# Patient Record
Sex: Male | Born: 1995 | Race: Black or African American | Hispanic: No | Marital: Single | Smoking: Never smoker
Health system: Southern US, Community
[De-identification: ages and names within clinical notes are randomized; demographics above are authoritative.]

---
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
## 2019-01-01 ENCOUNTER — Emergency Department
Admission: EM | Admit: 2019-01-01 | Discharge: 2019-01-01 | Disposition: A | Discharge status: Home / Self Care | Payer: Self-pay | Attending: Emergency Medicine | Admitting: Emergency Medicine

## 2019-01-01 ENCOUNTER — Other Ambulatory Visit: Payer: Self-pay

## 2019-01-01 ENCOUNTER — Encounter: Payer: Self-pay | Admitting: Emergency Medicine

## 2019-01-01 DIAGNOSIS — Y92310 Basketball court as the place of occurrence of the external cause: Secondary | ICD-10-CM | POA: Insufficient documentation

## 2019-01-01 DIAGNOSIS — F1210 Cannabis abuse, uncomplicated: Secondary | ICD-10-CM | POA: Insufficient documentation

## 2019-01-01 DIAGNOSIS — W500XXA Accidental hit or strike by another person, initial encounter: Secondary | ICD-10-CM | POA: Insufficient documentation

## 2019-01-01 DIAGNOSIS — Y998 Other external cause status: Secondary | ICD-10-CM | POA: Insufficient documentation

## 2019-01-01 DIAGNOSIS — S01511A Laceration without foreign body of lip, initial encounter: Secondary | ICD-10-CM | POA: Insufficient documentation

## 2019-01-01 DIAGNOSIS — Y9367 Activity, basketball: Secondary | ICD-10-CM | POA: Insufficient documentation

## 2019-01-01 MED ORDER — BACITRACIN-NEOMYCIN-POLYMYXIN 400-5-5000 EX OINT
TOPICAL_OINTMENT | Freq: Once | CUTANEOUS | Status: AC
  Administered 2019-01-01: 1 via TOPICAL
  Filled 2019-01-01: qty 1

## 2019-01-01 MED ORDER — LIDOCAINE HCL (PF) 1 % IJ SOLN
INTRAMUSCULAR | Status: AC
  Administered 2019-01-01: 10 mL
  Filled 2019-01-01: qty 10

## 2019-01-01 MED ORDER — LIDOCAINE HCL (PF) 1 % IJ SOLN
10.0000 mL | Freq: Once | INTRAMUSCULAR | Status: AC
  Administered 2019-01-01: 10 mL

## 2019-01-01 MED ORDER — AMOXICILLIN-POT CLAVULANATE 875-125 MG PO TABS: 1.0000 | ORAL_TABLET | Freq: Two times a day (BID) | ORAL | 0 refills | Status: DC

## 2019-01-01 MED ORDER — AMOXICILLIN-POT CLAVULANATE 875-125 MG PO TABS
1.0000 | ORAL_TABLET | Freq: Once | ORAL | Status: AC
  Administered 2019-01-01: 1 via ORAL
  Filled 2019-01-01 (×2): qty 1

## 2019-01-01 NOTE — ED Notes (Signed)
See triage note  Presents with injury to mouth  States he was elbowed to mouth  Laceration noted to upper lip near the [place] boarder   Tooth was stuck in lip  Was removed by provider on arrival

## 2019-01-01 NOTE — Op Note (Signed)
01/01/2019  6:23 PM    Jacky Kindle  [id number]   Pre-Op Dx: Laceration of the right upper lip involving the vermilion border all the way inside the lip through muscle and minor salivary glands.  Total length is 2.5 cm  Post-op Dx: Same  Proc: Complex repair of right upper lip laceration involving deep sutures and buried mucosal surface sutures and some Prolene sutures to the outside of the lip.  Surg:  Beverly Sessions Allyana Vogan  Anes:  GOT  EBL: Minimal  Comp: None  Findings: Jagged tear of the upper lip through his tooth.  This was from the vermilion border 1 cm above his oral commissure extending diagonally inside the mouth up to the right upper canine.  The tooth was buried in the lip when he first presented to the ER and then was removed.  This was through muscle and had minor salivary gland escaping through the opened lip wound.  He did not lacerate the labial artery.  Procedure: The wound was anesthetized with 1% lidocaine using approximately 4 mL.  After he was anesthetized he was washed with Betadine and water to clean the area and the wound.  The wound extended inside the mouth and unable to sterilize this area.  He was then draped in a sterile fashion.  Deep sutures were placed using 6-0 Vicryl to pull back together the muscle layers and to help buried the minor salivary glands under the mucosa.  The mucosa laceration was then sutured from the inside out using 5 oh chromics that had the knot buried.  Multiple interrupted sutures were placed for about 1-1/2 cm.  This left a little over a centimeter left of vermilion border that was visible from the outside.  6-0 Prolene was then used in running locking fashion for holding the vermilion border together properly.  Neosporin was then placed over the wound edge on the outside.  Dispo:   Patient is to be discharged home and rest at home.  Plan: Follow-up in the office in 4 days for suture removal of the Prolene.  He will use Vaseline on  his lip every day to protect it.  He will be given Augmentin for antibiotics to help prevent infection.  He will be given some pain medication from the ER physician to help control pain.  He can use some ice at home to help keep swelling to a minimum. Return sooner if problems.  Cammy Copa  01/01/2019 6:23 PM

## 2019-01-01 NOTE — Discharge Instructions (Addendum)
Keep the lip covered with a thin layer of petroleum jelly, throughout the day. Avoid licking or biting the sutures. Follow-up with Dr. Elenore RotaJuengel on Friday for suture removal. No sports until cleared by Dr. Elenore RotaJuengel.

## 2019-01-01 NOTE — ED Provider Notes (Signed)
[facility] Emergency Department Provider Note ____________________________________________  Time seen: upon arrival  I have reviewed the triage vital signs and the nursing notes.  HISTORY  Chief Complaint  Mouth Injury  HPI James Christensen is a 23 y.o. male resents himself to the ED from the gym, after he was hit in the upper lip by an elbow, while playing basketball.  Patient presents with a large laceration to the corner of the upper lip.  The injury caused his incisor tooth to impaled his upper lip on the buccal mucosa.  The deep laceration extends to the outer lip to the vermilion border.  He is unable to manipulate the upper lip at this time.  He denies any dental injury, loss of consciousness, nausea, vomiting and dizziness.  Patient denies any significant medical history and takes no daily medications.  History reviewed. No pertinent past medical history.  There are no active problems to display for this patient.  History reviewed. No pertinent surgical history.  Prior to Admission medications   Medication Sig Start Date End Date Taking? Authorizing Provider  amoxicillin-clavulanate (AUGMENTIN) 875-125 MG tablet Take 1 tablet by mouth 2 (two) times daily. 01/01/19   Loreli Debruler, Charlesetta Ivory, PA-C    Allergies Patient has no known allergies.  History reviewed. No pertinent family history.  Social History Social History   Tobacco Use  . Smoking status: Never Smoker  . Smokeless tobacco: Never Used  Substance Use Topics  . Alcohol use: Never    Frequency: Never  . Drug use: Yes    Types: Marijuana    Review of Systems  Constitutional: Negative for fever. Eyes: Negative for visual changes. ENT: Negative for sore throat. Upper lip laceration as above. Cardiovascular: Negative for chest pain. Respiratory: Negative for shortness of breath. Gastrointestinal: Negative for abdominal pain, vomiting and diarrhea. Genitourinary: Negative for  dysuria. Musculoskeletal: Negative for back pain. Skin: Negative for rash. Neurological: Negative for headaches, focal weakness or numbness. ____________________________________________  PHYSICAL EXAM:  VITAL SIGNS: ED Triage Vitals  Enc Vitals Group     BP 01/01/19 1552 119/68     Pulse Rate 01/01/19 1552 80     Resp 01/01/19 1552 18     Temp 01/01/19 1552 98.5 F (36.9 C)     Temp Source 01/01/19 1552 Oral     SpO2 01/01/19 1552 97 %     Weight 01/01/19 1549 135 lb (61.2 kg)     Height 01/01/19 1549 5\' 9"  (1.753 m)     Head Circumference --      Peak Flow --      Pain Score 01/01/19 1549 8     Pain Loc --      Pain Edu? --      Excl. in GC? --     Constitutional: Alert and oriented. Well appearing and in no distress. Head: Normocephalic and atraumatic. Eyes: Conjunctivae are normal. Normal extraocular movements Nose: No congestion/rhinorrhea/epistaxis. Mouth/Throat: Mucous membranes are moist. Upper lip with complex laceration exposing and extending through the orbicularis oris musculature. The dermal laceration extends to the vermilion border. No dental injury is appreciated. The primary incisor was initially embedded within the upper lip on the buccal side.  Cardiovascular: Normal rate, regular rhythm. Normal distal pulses. Respiratory: Normal respiratory effort. No wheezes/rales/rhonchi. Musculoskeletal: Nontender with normal range of motion in all extremities.  Neurologic:  Normal gait without ataxia. Normal speech and language. No gross focal neurologic deficits are appreciated. Skin:  Skin is warm, dry  and intact. No rash noted. ____________________________________________   MEDIA  See photos labeled "upper lip laceration" ____________________________________________  PROCEDURES  Procedures Augmentin 875 mg PO - dissolved in applesauce Neosporin ointment applied topically ____________________________________________  INITIAL IMPRESSION / ASSESSMENT AND PLAN  / ED COURSE  ----------------------------------------- 4:23 PM on 01/01/2019 ----------------------------------------- Page to P. Juengel (ENT) He will evaluate and treat the patient in the ED. See separate consult note for procedure note.  Patient with traumatic lip laceration, presents for treatment. The complex suture is repaired by the specialist, and initial antibiotic coverage is given with Augmentin. He will see Dr. Elenore RotaJuengel in the office on Friday for suture removal.  ____________________________________________  FINAL CLINICAL IMPRESSION(S) / ED DIAGNOSES  Final diagnoses:  Laceration of upper lip with complication, initial encounter      Lissa HoardMenshew, Kaleia Longhi V Bacon, PA-C 01/01/19 1837    Phineas SemenGoodman, Graydon, MD 01/01/19 [phone]

## 2019-01-01 NOTE — Consult Note (Signed)
James Christensen, James Christensen [id number] 1996-04-06 James Semen, MD  Reason for Consult: Acute laceration right upper lip  HPI: Patient is a 23 year old African-American male who is fairly healthy.  He was playing basketball, elbowed to his lip.  He thinks he was playing with his lip at the time of the elbow pushed his tooth right in the lip and cut his lip.  He is never had any stitches or lacerations to his lips before.  He did not have a lot of blood loss.  The laceration was noted in the ER to go from inside the lip all the way out to vermilion border.  This was through muscle and into some of the minor salivary glands.  Consultation was placed with ENT for repair of the lip laceration.  Allergies: No Known Allergies  ROS: Review of systems normal other than 12 systems except per HPI.  PMH: History reviewed. No pertinent past medical history.  FH: History reviewed. No pertinent family history.  SH:  Social History   Socioeconomic History  . Marital status: Single    Spouse name: Not on file  . Number of children: Not on file  . Years of education: Not on file  . Highest education level: Not on file  Occupational History  . Not on file  Social Needs  . Financial resource strain: Not on file  . Food insecurity:    Worry: Not on file    Inability: Not on file  . Transportation needs:    Medical: Not on file    Non-medical: Not on file  Tobacco Use  . Smoking status: Never Smoker  . Smokeless tobacco: Never Used  Substance and Sexual Activity  . Alcohol use: Never    Frequency: Never  . Drug use: Yes    Types: Marijuana  . Sexual activity: Not on file  Lifestyle  . Physical activity:    Days per week: Not on file    Minutes per session: Not on file  . Stress: Not on file  Relationships  . Social connections:    Talks on phone: Not on file    Gets together: Not on file    Attends religious service: Not on file    Active member of club or organization: Not on file   Attends meetings of clubs or organizations: Not on file    Relationship status: Not on file  . Intimate partner violence:    Fear of current or ex partner: Not on file    Emotionally abused: Not on file    Physically abused: Not on file    Forced sexual activity: Not on file  Other Topics Concern  . Not on file  Social History Narrative  . Not on file    PSH: History reviewed. No pertinent surgical history.  Physical  Exam: He is well-developed well-nourished no acute distress.  The laceration starts the right vermilion border about a centimeter above the right oral commissure.  Extends inward and a angled fashion up to the right upper canine tooth.  It is irregular laceration and tear that has angulation medially.  There is muscle showing and minor salivary glands that are extended out of the wound as it gapes open.  There is no significant bleeding.  His tooth is not loose and there is no intraoral lesions other than the laceration extends up into the mucosa inside the mouth.  No injury to his neck.  A complex repair of the laceration right upper lip was done in the  ER under local anesthesia.  This is dictated in detail elsewhere   A/P: Irregular laceration involving the full lip from the vermilion border extending inward into the mucosa up to the tooth.  Entire length of this was 2-1/2 cm.  It was closed in a complex repair.  He will cover it with Vaseline at home and will plan to come back to the office on Friday (4 days) for suture removal.  He has mostly buried sutures inside the lip but there are some 6-0 Prolene's on the outside lateral portion of the lip that is exposed to make sure that this heals appropriately.  He will be given antibiotics help prevent infection and will use pain medication as needed.   James Christensen 01/01/2019 6:17 PM

## 2019-01-01 NOTE — ED Triage Notes (Signed)
Pt got elbowed in upper lip. Through and through. Not into vermilion border.  Appears lip stuck on tooth still.

## 2019-01-05 ENCOUNTER — Ambulatory Visit
Admission: EM | Admit: 2019-01-05 | Discharge: 2019-01-05 | Discharge status: Against Medical Advice | Payer: Medicaid Other

## 2019-12-05 ENCOUNTER — Ambulatory Visit: Payer: Self-pay | Admitting: Family Medicine

## 2019-12-05 ENCOUNTER — Other Ambulatory Visit: Payer: Self-pay

## 2019-12-05 ENCOUNTER — Encounter: Payer: Self-pay | Admitting: Family Medicine

## 2019-12-05 DIAGNOSIS — Z113 Encounter for screening for infections with a predominantly sexual mode of transmission: Secondary | ICD-10-CM

## 2019-12-05 DIAGNOSIS — Z202 Contact with and (suspected) exposure to infections with a predominantly sexual mode of transmission: Secondary | ICD-10-CM

## 2019-12-05 MED ORDER — AZITHROMYCIN 500 MG PO TABS
1000.0000 mg | ORAL_TABLET | Freq: Once | ORAL | Status: AC
  Administered 2019-12-05: 10:00:00 1000 mg via ORAL

## 2019-12-05 NOTE — Progress Notes (Signed)
Here for STD testing.Burt Knack, RN   Patient treated as contact to Chlamydia.Burt Knack, RN

## 2019-12-05 NOTE — Progress Notes (Signed)
  [facility] Department STI clinic/screening visit  Subjective:  James Christensen is a 24 y.o. male being seen today for  Chief Complaint  Patient presents with  . Exposure to STD     The patient reports they do not have symptoms.   Patient has the following medical conditions:  There are no problems to display for this patient.   HPI  Pt reports he is a contact to chlamydia. Does not have any symptoms.   See flowsheet for further details and programmatic requirements.    No components found for: HCV  The following portions of the patient's history were reviewed and updated as appropriate: allergies, current medications, past medical history, past social history, past surgical history and problem list.  Objective:  There were no vitals filed for this visit.   Physical Exam Constitutional:      Appearance: Normal appearance.  HENT:     Head: Normocephalic and atraumatic.     Comments: No nits or hair loss    Mouth/Throat:     Mouth: Mucous membranes are moist.     Pharynx: Oropharynx is clear. No oropharyngeal exudate or posterior oropharyngeal erythema.  Pulmonary:     Effort: Pulmonary effort is normal.  Abdominal:     General: Abdomen is flat.     Palpations: Abdomen is soft. There is no hepatomegaly or mass.     Tenderness: There is no abdominal tenderness.  Genitourinary:    Pubic Area: No rash or pubic lice.      Penis: Normal and circumcised.      Testes: Normal.     Epididymis:     Right: Normal.     Left: Normal.     Rectum: Normal.  Lymphadenopathy:     Head:     Right side of head: No preauricular or posterior auricular adenopathy.     Left side of head: No preauricular or posterior auricular adenopathy.     Cervical: No cervical adenopathy.     Upper Body:     Right upper body: No supraclavicular or axillary adenopathy.     Left upper body: No supraclavicular or axillary adenopathy.     Lower Body: Right inguinal adenopathy (shotty)  present. Left inguinal adenopathy (shotty) present.  Skin:    General: Skin is warm and dry.     Findings: No rash.  Neurological:     Mental Status: He is alert and oriented to person, place, and time.       Assessment and Plan:  James Christensen is a 24 y.o. male presenting to the [facility] Department for STI screening   1. Screening examination for venereal disease -Screenings today as below. Urine GC/chlamydia obtained instead of gram stain d/t [facility] staffing changes. -Patient does meet criteria for HepB, HepC Screening. Accepts these screenings. -Counseled on warning s/sx and when to seek care. Recommended condom use with all sex and discussed importance of condom use for STI prevention. - GC/Chlamydia Probe Amp([facility]) - HIV/HCV [place] [facility] - HBV Antigen/Antibody [facility] - Syphilis Serology, [place] [facility]  2. Chlamydia contact -Treatment today as below. Pt to RTC if vomits < 2 hr after taking medicine. No known allergies to these medications. -Advised no sex for 7 days after both pt and partner completes treatment and encouraged condoms with all sex. - azithromycin (ZITHROMAX) tablet 1,000 mg     Return for screening as needed.  No future appointments.  Ann Held, PA-C

## 2019-12-07 ENCOUNTER — Telehealth: Payer: Self-pay

## 2019-12-07 DIAGNOSIS — A749 Chlamydial infection, unspecified: Secondary | ICD-10-CM

## 2019-12-07 LAB — GC/CHLAMYDIA PROBE AMP
Chlamydia trachomatis, NAA: POSITIVE — AB
Neisseria Gonorrhoeae by PCR: NEGATIVE

## 2019-12-07 NOTE — Telephone Encounter (Signed)
TC with patient.  Verified ID via address and DOB. No password from visit. Informed of +Chlamydia. Patient tx'd at visit on 12/05/19 and tolerated well. Co partner tx and still not sex for 7 days.  Verbalized understanding Richmond Campbell, RN

## 2019-12-11 LAB — HEPATITIS B SURFACE ANTIGEN

## 2019-12-14 LAB — HM HEPATITIS C SCREENING LAB: HM Hepatitis Screen: NEGATIVE

## 2019-12-14 LAB — HM HIV SCREENING LAB: HM HIV Screening: NEGATIVE

## 2019-12-26 ENCOUNTER — Ambulatory Visit: Payer: Medicaid Other

## 2019-12-28 ENCOUNTER — Ambulatory Visit: Payer: Medicaid Other

## 2020-01-04 ENCOUNTER — Ambulatory Visit: Payer: Self-pay | Admitting: Physician Assistant

## 2020-01-04 ENCOUNTER — Other Ambulatory Visit: Payer: Self-pay

## 2020-01-04 ENCOUNTER — Encounter: Payer: Self-pay | Admitting: Physician Assistant

## 2020-01-04 DIAGNOSIS — Z113 Encounter for screening for infections with a predominantly sexual mode of transmission: Secondary | ICD-10-CM

## 2020-01-04 DIAGNOSIS — Z202 Contact with and (suspected) exposure to infections with a predominantly sexual mode of transmission: Secondary | ICD-10-CM

## 2020-01-04 LAB — GRAM STAIN

## 2020-01-04 MED ORDER — AZITHROMYCIN 500 MG PO TABS
1000.0000 mg | ORAL_TABLET | Freq: Once | ORAL | Status: AC
  Administered 2020-01-04: 1000 mg via ORAL

## 2020-01-04 NOTE — Progress Notes (Signed)
   [facility] Department STI clinic/screening visit  Subjective:  James Christensen is a 24 y.o. male being seen today for an STI screening visit. The patient reports they do not have symptoms.    Patient has the following medical conditions:  There are no problems to display for this patient.    Chief Complaint  Patient presents with  . SEXUALLY TRANSMITTED DISEASE    screening     HPI  Patient reports that he is not having any symptoms but would like a screening today.  Reports that he was told by a partner that he is a contact to Chlamydia.   See flowsheet for further details and programmatic requirements.    The following portions of the patient's history were reviewed and updated as appropriate: allergies, current medications, past medical history, past social history, past surgical history and problem list.  Objective:  There were no vitals filed for this visit.  Physical Exam Constitutional:      General: He is not in acute distress.    Appearance: Normal appearance. He is normal weight.  HENT:     Head: Normocephalic and atraumatic.     Comments: No nits, lice, or hair loss. No cervical, supraclavicular or axillary adenopathy.    Mouth/Throat:     Mouth: Mucous membranes are moist.     Pharynx: Oropharynx is clear. No oropharyngeal exudate or posterior oropharyngeal erythema.  Eyes:     Conjunctiva/sclera: Conjunctivae normal.  Pulmonary:     Effort: Pulmonary effort is normal.  Abdominal:     Palpations: Abdomen is soft. There is no mass.     Tenderness: There is no abdominal tenderness. There is no guarding or rebound.  Genitourinary:    Penis: Normal.      Testes: Normal.     Comments: Pubic area without nits, lice, edema, erythema, lesions and inguinal adenopathy. Penis circumcised, without rash, lesions and discharge from meatus. Musculoskeletal:     Cervical back: Neck supple. No tenderness.  Skin:    General: Skin is warm and dry.     Findings: No bruising, erythema, lesion or rash.  Neurological:     Mental Status: He is alert and oriented to person, place, and time.  Psychiatric:        Mood and Affect: Mood normal.        Thought Content: Thought content normal.        Judgment: Judgment normal.       Assessment and Plan:  James Christensen is a 24 y.o. male presenting to the [facility] Department for STI screening  1. Screening for STD (sexually transmitted disease) Patient into clinic without symptoms.  Declines blood work today. Rec condoms with all sex. Await test results.  Counseled that RN will call if needs to RTC for further treatment once results are back.  - Gram stain - Gonococcus culture - Gonococcus culture  2. Chlamydia contact Treat as a contact to Chlamydia with Azithromycin 1g po DOT today. No sex for  7 days and until after partner/s complete their treatment. RTC for re-treatment if vomits < 2 hr after taking medicine. - azithromycin (ZITHROMAX) tablet 1,000 mg     No follow-ups on file.  No future appointments.  Matt Holmes, PA

## 2020-01-04 NOTE — Progress Notes (Signed)
+  chlamydia in January 2021. Declines HIV/RPR. Richmond Campbell, RN   Gram stain reviewed.  Tx'd per [place], [place] order. Richmond Campbell, RN

## 2020-01-08 LAB — GONOCOCCUS CULTURE

## 2020-03-19 ENCOUNTER — Ambulatory Visit: Payer: Medicaid Other

## 2020-03-21 ENCOUNTER — Ambulatory Visit: Payer: Self-pay | Admitting: Family Medicine

## 2020-03-21 ENCOUNTER — Other Ambulatory Visit: Payer: Self-pay

## 2020-03-21 ENCOUNTER — Encounter: Payer: Self-pay | Admitting: Family Medicine

## 2020-03-21 DIAGNOSIS — Z113 Encounter for screening for infections with a predominantly sexual mode of transmission: Secondary | ICD-10-CM

## 2020-03-21 DIAGNOSIS — N341 Nonspecific urethritis: Secondary | ICD-10-CM

## 2020-03-21 LAB — GRAM STAIN

## 2020-03-21 MED ORDER — AZITHROMYCIN 500 MG PO TABS
1000.0000 mg | ORAL_TABLET | Freq: Once | ORAL | Status: AC
  Administered 2020-03-21: 1000 mg via ORAL

## 2020-03-21 NOTE — Progress Notes (Signed)
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 SEXUALLY TRANSMITTED DISEASE     The patient reports they do not have symptoms.   Patient has the following medical conditions:  There are no problems to display for this patient.   HPI  Pt reports he is here for STI screening, denies symptoms.   See flowsheet for further details and programmatic requirements.   No components found for: HCV  The following portions of the patient's history were reviewed and updated as appropriate: allergies, current medications, past medical history, past social history, past surgical history and problem list.  Objective:  There were no vitals filed for this visit.   Physical Exam Constitutional:      Appearance: Normal appearance.  HENT:     Head: Normocephalic and atraumatic.     Comments: No nits or hair loss    Mouth/Throat:     Mouth: Mucous membranes are moist.     Pharynx: Oropharynx is clear. No oropharyngeal exudate or posterior oropharyngeal erythema.  Pulmonary:     Effort: Pulmonary effort is normal.  Abdominal:     General: Abdomen is flat.     Palpations: Abdomen is soft. There is no hepatomegaly or mass.     Tenderness: There is no abdominal tenderness.  Genitourinary:    Pubic Area: No rash or pubic lice.      Penis: Normal and circumcised.      Testes: Normal.     Epididymis:     Right: Normal.     Left: Normal.     Rectum: Normal.  Lymphadenopathy:     Head:     Right side of head: No preauricular or posterior auricular adenopathy.     Left side of head: No preauricular or posterior auricular adenopathy.     Cervical: No cervical adenopathy.     Upper Body:     Right upper body: No supraclavicular or axillary adenopathy.     Left upper body: No supraclavicular or axillary adenopathy.     Lower Body: No right inguinal adenopathy. No  left inguinal adenopathy.  Skin:    General: Skin is warm and dry.     Findings: No rash.  Neurological:     Mental Status: He is alert and oriented to person, place, and time.       Assessment and Plan:  James Christensen is a 24 y.o. male presenting to the [facility] Department for STI screening   1. Screening examination for venereal disease -Pt without symptoms. Screenings today as below. Treat gram stain per standing order. -Patient does meet criteria for HepB, HepC Screening. Accepts these screenings. -Counseled on warning s/sx and when to seek care. Recommended condom use with all sex and discussed importance of condom use for STI prevention. - Gram stain - Chlamydia/Gonorrhea [place] [facility] - HBV Antigen/Antibody [facility] - HIV/HCV [place] [facility] - Syphilis Serology, [place] [facility]  2. NGU (nongonococcal urethritis) -Treatment today as below.  -Pt counseled regarding medication, including to RTC if vomits < 2 hr after taking medicine. No known allergies to this medication. -Advised no sex for 7 days after both pt and partners completes treatment and encouraged condoms with all sex. Contact cards given. - azithromycin (ZITHROMAX) tablet 1,000 mg  The patient was dispensed azithromycin today. I provided counseling today regarding the medication. We discussed the  medication, the side effects and when to call clinic. Patient given the opportunity to ask questions. Questions answered.  Lot: [id number] Expiration: 02/19/2022  Return if symptoms worsen or fail to improve.  No future appointments.  Kandee Keen, PA-C

## 2020-03-22 NOTE — Progress Notes (Signed)
Chart reviewed by Pharmacist  Suzanne Walker PharmD, Contract Pharmacist at [facility]  

## 2020-03-26 LAB — GONOCOCCUS CULTURE

## 2020-03-26 LAB — HEPATITIS B SURFACE ANTIGEN

## 2020-03-31 LAB — HM HEPATITIS C SCREENING LAB: HM Hepatitis Screen: NEGATIVE

## 2020-03-31 LAB — HM HIV SCREENING LAB: HM HIV Screening: NEGATIVE

## 2020-04-24 ENCOUNTER — Encounter: Payer: Self-pay | Admitting: Physician Assistant

## 2020-04-24 ENCOUNTER — Ambulatory Visit: Payer: Self-pay | Admitting: Physician Assistant

## 2020-04-24 ENCOUNTER — Other Ambulatory Visit: Payer: Self-pay

## 2020-04-24 DIAGNOSIS — Z113 Encounter for screening for infections with a predominantly sexual mode of transmission: Secondary | ICD-10-CM

## 2020-04-24 DIAGNOSIS — Z202 Contact with and (suspected) exposure to infections with a predominantly sexual mode of transmission: Secondary | ICD-10-CM

## 2020-04-24 LAB — GRAM STAIN

## 2020-04-24 MED ORDER — AZITHROMYCIN 500 MG PO TABS
1000.0000 mg | ORAL_TABLET | Freq: Once | ORAL | Status: AC
Start: 2020-04-24 — End: 2020-04-24
  Administered 2020-04-24: 1000 mg via ORAL

## 2020-04-24 NOTE — Progress Notes (Signed)
   [facility] Department STI clinic/screening visit  Subjective:  James Christensen is a 24 y.o. male being seen today for an STI screening visit. The patient reports they do not have symptoms.    Patient has the following medical conditions:  There are no problems to display for this patient.    Chief Complaint  Patient presents with  . SEXUALLY TRANSMITTED DISEASE    STD screening    HPI  Patient reports that he is not having any symptoms but is a contact to Chlamydia.  Denies chronic conditions, regular medicines and h/o surgeries.  Last HIV test 1 month ago per patient.   See flowsheet for further details and programmatic requirements.    The following portions of the patient's history were reviewed and updated as appropriate: allergies, current medications, past medical history, past social history, past surgical history and problem list.  Objective:  There were no vitals filed for this visit.  Physical Exam Constitutional:      General: He is not in acute distress.    Appearance: Normal appearance.  HENT:     Head: Normocephalic and atraumatic.     Comments: No nits, lice, or hair loss. No cervical, supraclavicular or axillary adenopathy.    Mouth/Throat:     Mouth: Mucous membranes are moist.     Pharynx: Oropharynx is clear. No oropharyngeal exudate or posterior oropharyngeal erythema.  Eyes:     Conjunctiva/sclera: Conjunctivae normal.  Pulmonary:     Effort: Pulmonary effort is normal.  Abdominal:     Palpations: Abdomen is soft. There is no mass.     Tenderness: There is no abdominal tenderness. There is no guarding or rebound.  Genitourinary:    Penis: Normal.      Testes: Normal.     Comments: Pubic area without nits, lice, edema, erythema, lesions and inguinal adenopathy. Penis circumcised, without rash, lesions and discharge from meatus. Musculoskeletal:     Cervical back: Neck supple. No tenderness.  Skin:    General: Skin is warm  and dry.     Findings: No bruising, erythema, lesion or rash.  Neurological:     Mental Status: He is alert and oriented to person, place, and time.  Psychiatric:        Mood and Affect: Mood normal.        Behavior: Behavior normal.        Thought Content: Thought content normal.        Judgment: Judgment normal.       Assessment and Plan:  James Christensen is a 24 y.o. male presenting to the [facility] Department for STI screening  1. Screening for STD (sexually transmitted disease) Patient into clinic without symptoms.  Declines blood work today. Rec condoms with all sex. Await test results.  Counseled that RN will call if needs to RTC for further treatment once results are back . - Gram stain - Gonococcus culture  2. Chlamydia contact Treat as a contact to Chlamydia (and NGU per Gram stain) with Azithromycin 1 g po DOT today. No sex for 7 days and until after partner completes treatment. RTC for re-treatment if vomits < 2 hr after taking medicine. - azithromycin (ZITHROMAX) tablet 1,000 mg     Return in about 3 months (around 07/25/2020) for TOC and PRN.  No future appointments.  Matt Holmes, PA

## 2020-04-24 NOTE — Progress Notes (Signed)
Gram stain reviewed and pt treated for NGU and contact to Chlamydia per standing order and per provider order. Provider orders completed.

## 2020-04-24 NOTE — Progress Notes (Signed)
Pt here for STD screening and reports he is a contact to Chlamydia. 

## 2020-04-25 ENCOUNTER — Encounter: Payer: Self-pay | Admitting: Physician Assistant

## 2020-04-29 LAB — GONOCOCCUS CULTURE

## 2020-07-15 ENCOUNTER — Encounter: Payer: Self-pay | Admitting: Emergency Medicine

## 2020-07-15 ENCOUNTER — Other Ambulatory Visit: Payer: Self-pay

## 2020-07-15 ENCOUNTER — Emergency Department
Admission: EM | Admit: 2020-07-15 | Discharge: 2020-07-15 | Disposition: A | Discharge status: Home / Self Care | Payer: Medicaid Other | Attending: Emergency Medicine | Admitting: Emergency Medicine

## 2020-07-15 ENCOUNTER — Emergency Department: Payer: Medicaid Other

## 2020-07-15 DIAGNOSIS — Y929 Unspecified place or not applicable: Secondary | ICD-10-CM | POA: Insufficient documentation

## 2020-07-15 DIAGNOSIS — S8001XA Contusion of right knee, initial encounter: Secondary | ICD-10-CM | POA: Insufficient documentation

## 2020-07-15 DIAGNOSIS — Y999 Unspecified external cause status: Secondary | ICD-10-CM | POA: Insufficient documentation

## 2020-07-15 DIAGNOSIS — Z79899 Other long term (current) drug therapy: Secondary | ICD-10-CM | POA: Insufficient documentation

## 2020-07-15 DIAGNOSIS — Y9302 Activity, running: Secondary | ICD-10-CM | POA: Insufficient documentation

## 2020-07-15 DIAGNOSIS — W010XXA Fall on same level from slipping, tripping and stumbling without subsequent striking against object, initial encounter: Secondary | ICD-10-CM | POA: Insufficient documentation

## 2020-07-15 DIAGNOSIS — S62525A Nondisplaced fracture of distal phalanx of left thumb, initial encounter for closed fracture: Secondary | ICD-10-CM | POA: Insufficient documentation

## 2020-07-15 MED ORDER — MELOXICAM 15 MG PO TABS: 15.0000 mg | ORAL_TABLET | Freq: Every day | ORAL | 0 refills | Status: DC

## 2020-07-15 MED ORDER — MELOXICAM 7.5 MG PO TABS
15.0000 mg | ORAL_TABLET | Freq: Once | ORAL | Status: AC
  Administered 2020-07-15: 15 mg via ORAL
  Filled 2020-07-15: qty 2

## 2020-07-15 MED ORDER — TRAMADOL HCL 50 MG PO TABS
50.0000 mg | ORAL_TABLET | Freq: Once | ORAL | Status: AC
  Administered 2020-07-15: 50 mg via ORAL
  Filled 2020-07-15: qty 1

## 2020-07-15 MED ORDER — TRAMADOL HCL 50 MG PO TABS: 50.0000 mg | ORAL_TABLET | Freq: Four times a day (QID) | ORAL | 0 refills | Status: DC | PRN

## 2020-07-15 NOTE — ED Triage Notes (Signed)
Pt reports fell Sunday and hurt his left hand index finger and right knee.

## 2020-07-15 NOTE — ED Provider Notes (Addendum)
[facility] Emergency Department Provider Note  ____________________________________________  Time seen: Approximately 7:59 PM  I have reviewed the triage vital signs and the nursing notes.   HISTORY  Chief Complaint Fall, Finger Injury, and Knee Pain    HPI James Christensen is a 24 y.o. male who presents the emergency department complaining of left arm, right knee pain.  Patient states that he was running 2 days ago, tripped, fell injuring both areas.  Patient states that he has had a "knot" to the tibial tuberosity region since a kid.  Is never had it evaluated by pain is to this area.  No loss of range of motion to the knee.  He still ambulatory.  Patient has pain, swelling, ecchymosis to the left arm.  Still able to extend and flex the thumb at this time.  No loss of sensation.  No other injury or complaint.  No medications prior to arrival.         History reviewed. No pertinent past medical history.  There are no problems to display for this patient.   History reviewed. No pertinent surgical history.  Prior to Admission medications   Medication Sig Start Date End Date Taking? Authorizing Provider  meloxicam (MOBIC) 15 MG tablet Take 1 tablet (15 mg total) by mouth daily. 07/15/20   Cameo Shewell, Delorise Royals, PA-C  traMADol (ULTRAM) 50 MG tablet Take 1 tablet (50 mg total) by mouth every 6 (six) hours as needed. 07/15/20   Quasean Frye, Delorise Royals, PA-C    Allergies Patient has no known allergies.  No family history on file.  Social History Social History   Tobacco Use  . Smoking status: Never Smoker  . Smokeless tobacco: Never Used  Vaping Use  . Vaping Use: Never used  Substance Use Topics  . Alcohol use: Never  . Drug use: Yes    Frequency: 7.0 times per week    Types: Marijuana     Review of Systems  Constitutional: No fever/chills Eyes: No visual changes. No discharge ENT: No upper respiratory complaints. Cardiovascular: no chest  pain. Respiratory: no cough. No SOB. Gastrointestinal: No abdominal pain.  No nausea, no vomiting.  No diarrhea.  No constipation. Musculoskeletal: Right knee and left thumb injury from a fall Skin: Negative for rash, abrasions, lacerations, ecchymosis. Neurological: Negative for headaches, focal weakness or numbness. 10-point ROS otherwise negative.  ____________________________________________   PHYSICAL EXAM:  VITAL SIGNS: ED Triage Vitals  Enc Vitals Group     BP 07/15/20 1448 117/62     Pulse Rate 07/15/20 1448 (!) 53     Resp 07/15/20 1448 16     Temp 07/15/20 1448 98.1 F (36.7 C)     Temp Source 07/15/20 1448 Oral     SpO2 07/15/20 1448 95 %     Weight 07/15/20 1444 135 lb (61.2 kg)     Height 07/15/20 1444 5\' 9"  (1.753 m)     Head Circumference --      Peak Flow --      Pain Score 07/15/20 1444 8     Pain Loc --      Pain Edu? --      Excl. in GC? --      Constitutional: Alert and oriented. Well appearing and in no acute distress. Eyes: Conjunctivae are normal. PERRL. EOMI. Head: Atraumatic. ENT:      Ears:       Nose: No congestion/rhinnorhea.      Mouth/Throat: Mucous membranes are moist.  Neck: No stridor.    Cardiovascular: Normal rate, regular rhythm. Normal S1 and S2.  Good peripheral circulation. Respiratory: Normal respiratory effort without tachypnea or retractions. Lungs CTAB. Good air entry to the bases with no decreased or absent breath sounds. Musculoskeletal: Full range of motion to all extremities. No gross deformities appreciated.  Visualization of the left hand reveals ecchymosis, edema to the left thumb primarily over the distal phalanx.  No deformity.  Good extension and flexion.  Sensation capillary refill intact.  Distal phalanx is very tender to palpation.  No other appreciable injuries to the left hand.  Visualization of the right knee reveals no edema, ecchymosis, abrasions or lacerations.  Patient does have tenderness over the tibial  tuberosity with mild palpable abnormality.  No crepitus.  Special tests of the knee are negative.  No other tenderness.  Dorsalis pedis pulses sensation intact distally. Neurologic:  Normal speech and language. No gross focal neurologic deficits are appreciated.  Skin:  Skin is warm, dry and intact. No rash noted. Psychiatric: Mood and affect are normal. Speech and behavior are normal. Patient exhibits appropriate insight and judgement.   ____________________________________________   LABS (all labs ordered are listed, but only abnormal results are displayed)  Labs Reviewed - No data to display ____________________________________________  EKG   ____________________________________________  RADIOLOGY I personally viewed and evaluated these images as part of my medical decision making, as well as reviewing the written report by the radiologist.  DG Knee Complete 4 Views Right  Result Date: 07/15/2020 CLINICAL DATA:  Fall 2 days ago with right knee pain. EXAM: RIGHT KNEE - COMPLETE 4+ VIEW COMPARISON:  None. FINDINGS: No evidence of fracture, dislocation, or joint effusion. Fragmented anterior tibial tubercle is chronic, however there is adjacent soft tissue thickening. Alignment joint spaces are normal. Soft tissues are unremarkable. IMPRESSION: 1. No fracture or subluxation. 2. 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 Mild adjacent soft tissue thickening may represent patellar tendinopathy. Electronically Signed   By: Narda Rutherford M.D.   On: 07/15/2020 18:57   DG Hand Complete Left  Result Date: 07/15/2020 CLINICAL DATA:  Initial evaluation for acute trauma, fall. EXAM: LEFT HAND - COMPLETE 3+ VIEW COMPARISON:  None. FINDINGS: There is an acute nondisplaced transverse fracture extending through the mid aspect of the left first distal phalanx. No other acute fracture or dislocation. Joint spaces well maintained. Osseous mineralization normal. No other visible soft tissue  injury. IMPRESSION: Acute nondisplaced transverse fracture through the mid aspect of the left first distal phalanx. Electronically Signed   By: Rise Mu M.D.   On: 07/15/2020 18:58    ____________________________________________    PROCEDURES  Procedure(s) performed:    .Splint Application  Date/Time: 07/15/2020 8:12 PM Performed by: Racheal Patches, PA-C Authorized by: Racheal Patches, PA-C   Consent:    Consent obtained:  Verbal   Consent given by:  Patient   Risks discussed:  Pain and swelling Pre-procedure details:    Sensation:  Normal Procedure details:    Laterality:  Left   Location:  Finger   Finger:  L thumb   Splint type:  Thumb spica   Supplies:  Cotton padding, Ortho-Glass and elastic bandage Post-procedure details:    Pain:  Improved   Sensation:  Normal   Patient tolerance of procedure:  Tolerated well, no immediate complications      Medications  meloxicam (MOBIC) tablet 15 mg (15 mg Oral Given 07/15/20 2010)  traMADol (ULTRAM) tablet 50 mg (50 mg  Oral Given 07/15/20 2010)     ____________________________________________   INITIAL IMPRESSION / ASSESSMENT AND PLAN / ED COURSE  Pertinent labs & imaging results that were available during my care of the patient were reviewed by me and considered in my medical decision making (see chart for details).  Review of the [place] CSRS was performed in accordance of the [facility] prior to dispensing any controlled drugs.           Patient's diagnosis is consistent with nondisplaced fracture of the distal phalanx of the left thumb and contusion of the right knee.  Patient presented to emergency department after tripping and falling while running.  Overall exam is reassuring.  Imaging reveals nondisplaced fracture of the distal phalanx of the left thumb.  Remote fracture/bony formation likely secondary to [employer] disease as a teenager.  No acute fractures identified..  Patient will have  thumb spica splint applied.  Meloxicam and Ultram for symptom relief.  Follow-up with orthopedics.  Patient is given ED precautions to return to the ED for any worsening or new symptoms.     ____________________________________________  FINAL CLINICAL IMPRESSION(S) / ED DIAGNOSES  Final diagnoses:  Closed nondisplaced fracture of distal phalanx of left thumb, initial encounter  Contusion of right knee, initial encounter      NEW MEDICATIONS STARTED DURING THIS VISIT:  ED Discharge Orders         Ordered    traMADol (ULTRAM) 50 MG tablet  Every 6 hours PRN        07/15/20 2008    meloxicam (MOBIC) 15 MG tablet  Daily        07/15/20 2008              This chart was dictated using voice recognition software/Dragon. Despite best efforts to proofread, errors can occur which can change the meaning. Any change was purely unintentional.    Racheal Patches, PA-C 07/15/20 2012    Lanette Hampshire 07/15/20 2013    Gilles Chiquito, MD 07/15/20 2145

## 2021-05-28 ENCOUNTER — Ambulatory Visit: Payer: Medicaid Other

## 2021-12-16 IMAGING — CR DG HAND COMPLETE 3+V*L*
3 series · 3 of 3 positions shown · non-contrast
Comparison: None.

CLINICAL DATA: Initial evaluation for acute trauma, fall.

EXAM:
LEFT HAND - COMPLETE 3+ VIEW

[hand ap]
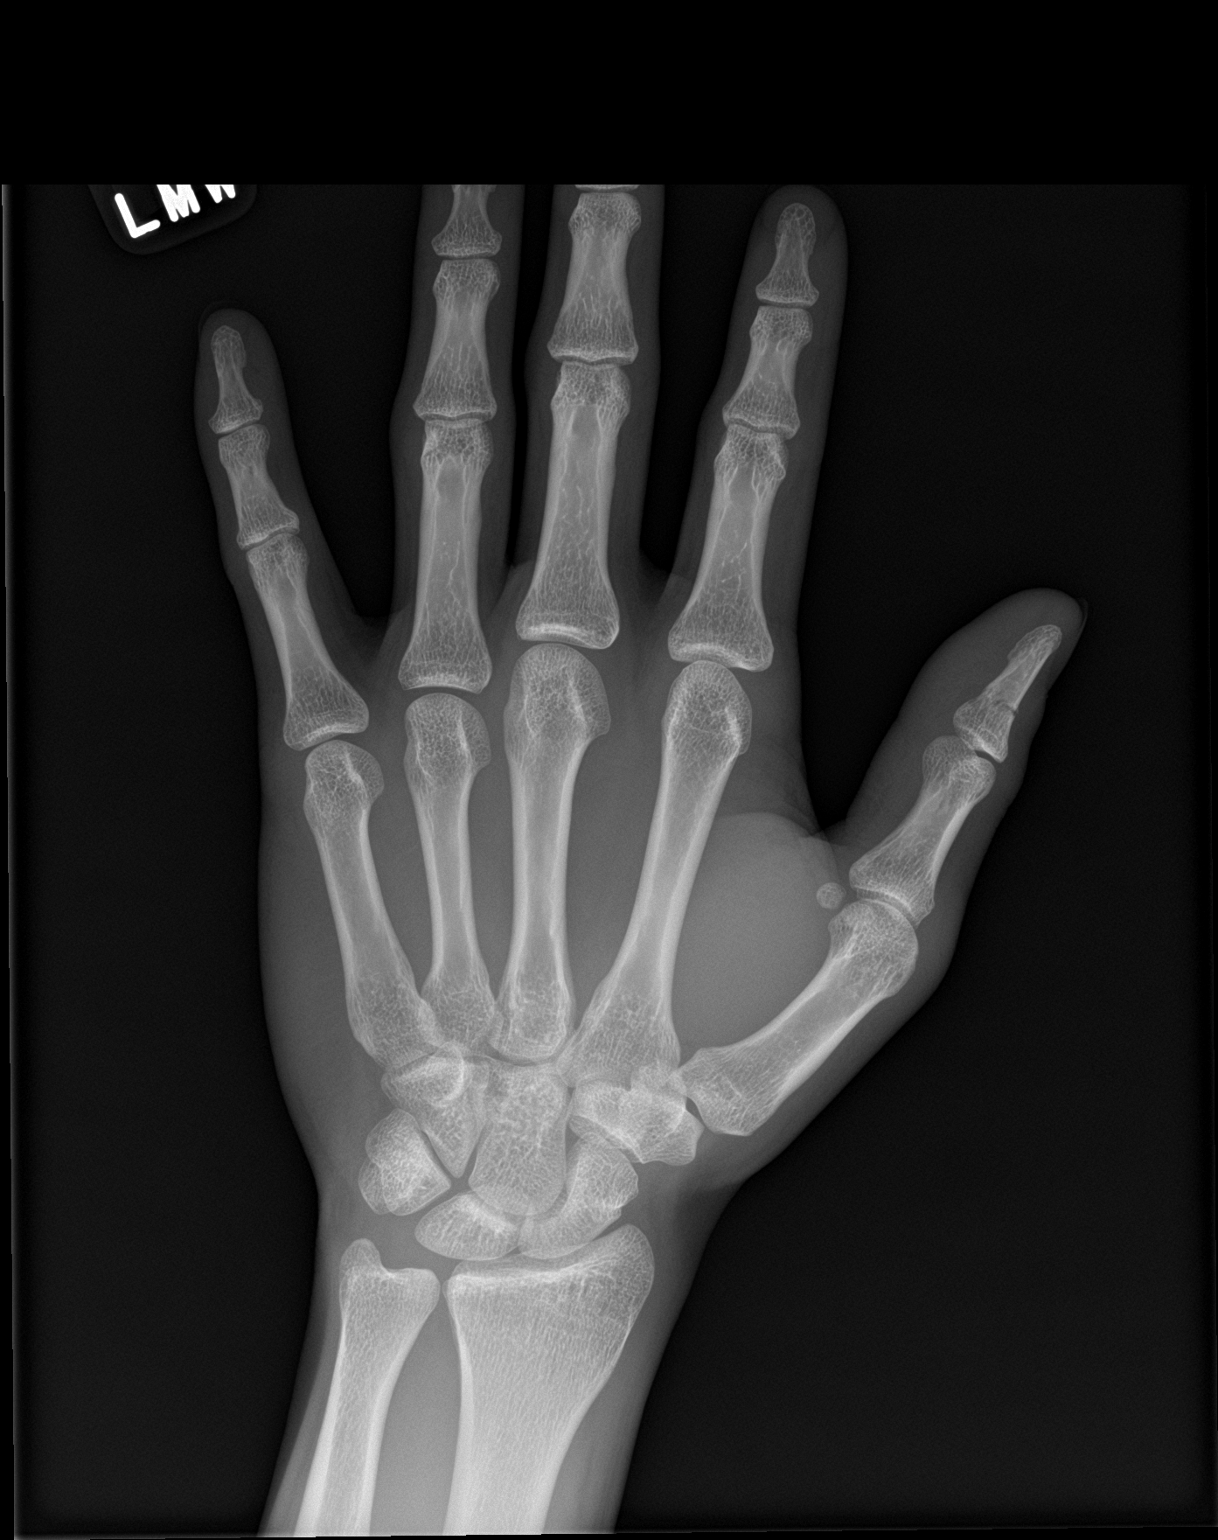

[hand obl]
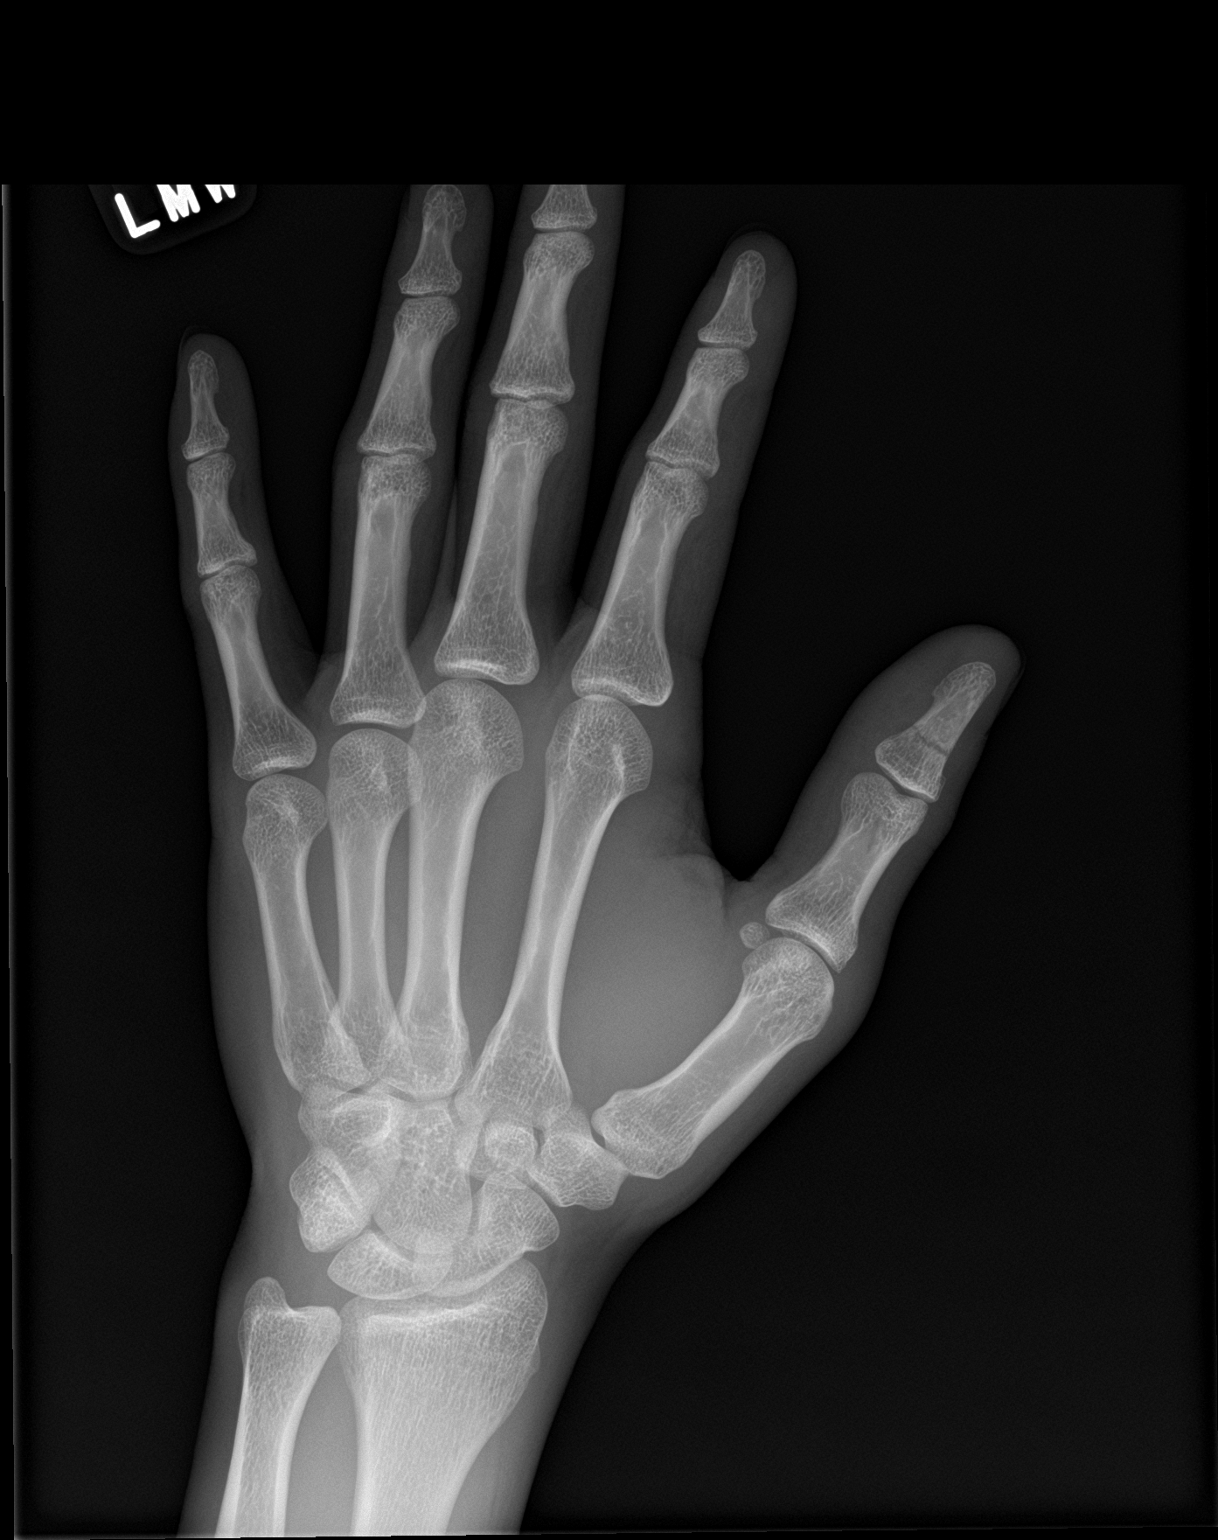

[hand lat]
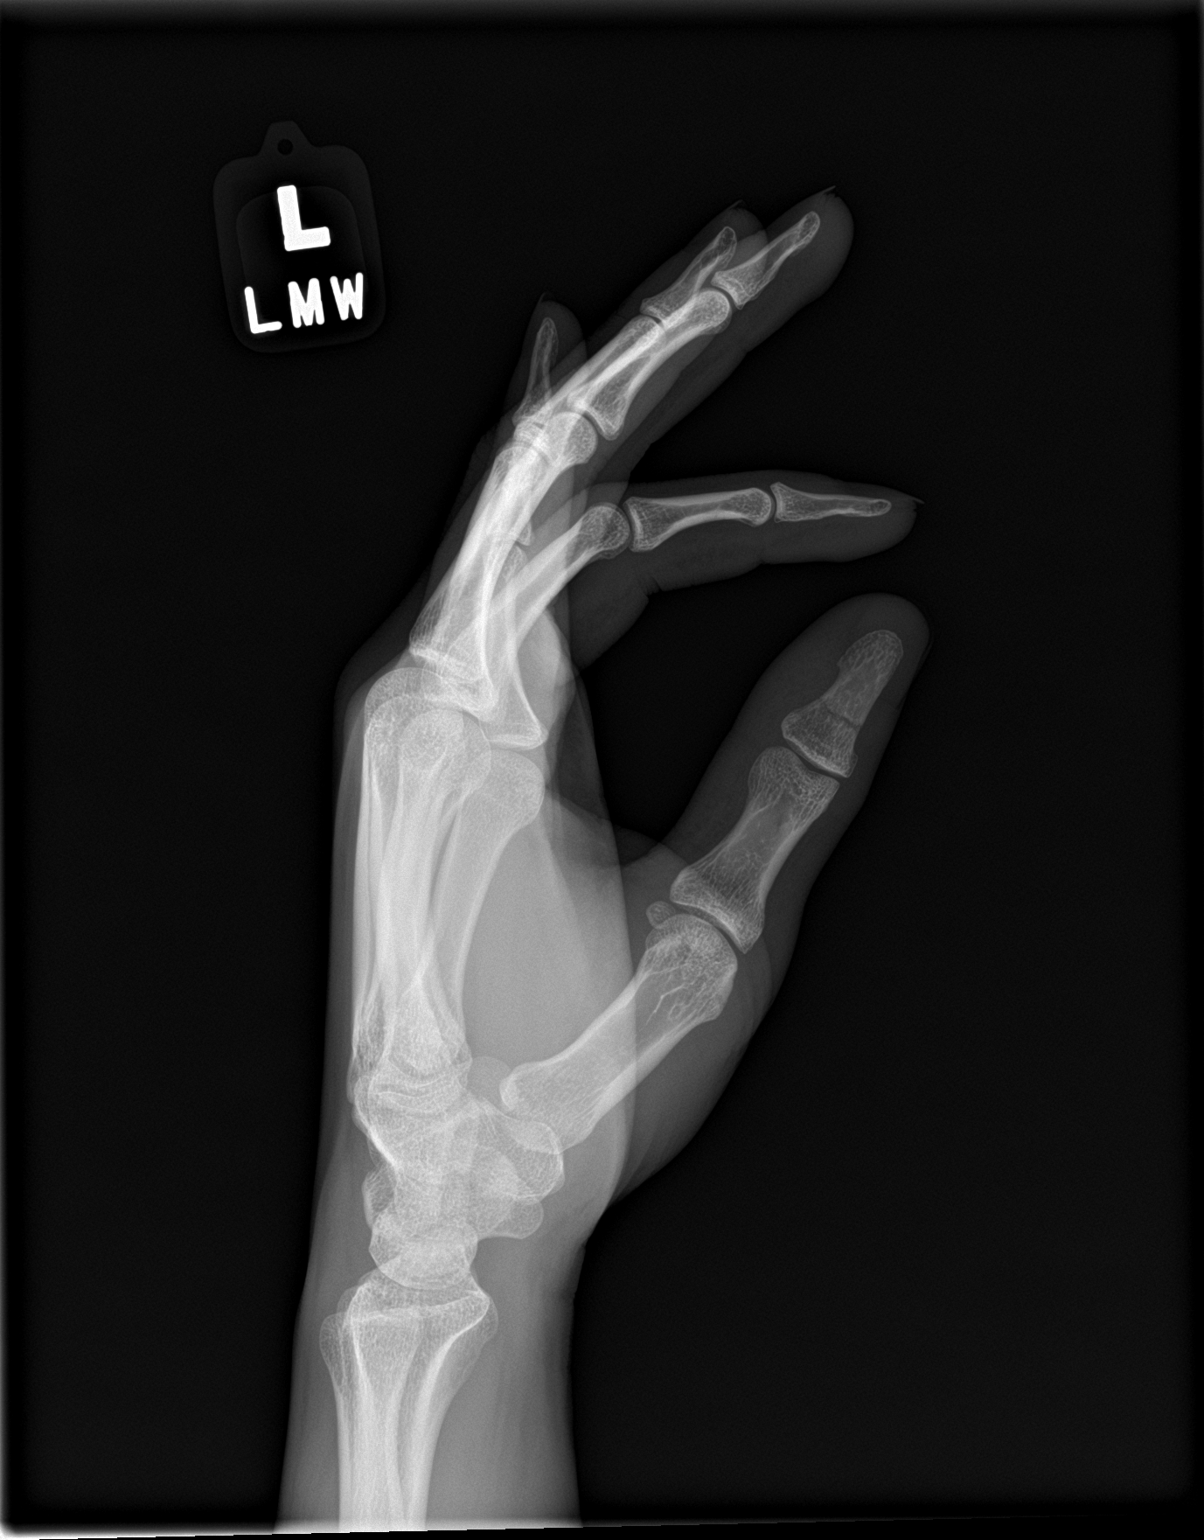

[3 of 3 positions shown; findings below may reference images not displayed]

FINDINGS: There is an acute nondisplaced transverse fracture extending through
the mid aspect of the left first distal phalanx. No other acute
fracture or dislocation. Joint spaces well maintained. Osseous
mineralization normal. No other visible soft tissue injury.
IMPRESSION: Acute nondisplaced transverse fracture through the mid aspect of the
left first distal phalanx.

## 2022-04-03 ENCOUNTER — Emergency Department: Payer: Medicaid Other

## 2022-04-03 ENCOUNTER — Other Ambulatory Visit: Payer: Self-pay

## 2022-04-03 ENCOUNTER — Emergency Department
Admission: EM | Admit: 2022-04-03 | Discharge: 2022-04-03 | Disposition: A | Discharge status: Home / Self Care | Payer: Medicaid Other | Attending: Emergency Medicine | Admitting: Emergency Medicine

## 2022-04-03 ENCOUNTER — Encounter: Payer: Self-pay | Admitting: Emergency Medicine

## 2022-04-03 DIAGNOSIS — Y92838 Other recreation area as the place of occurrence of the external cause: Secondary | ICD-10-CM | POA: Insufficient documentation

## 2022-04-03 DIAGNOSIS — X58XXXA Exposure to other specified factors, initial encounter: Secondary | ICD-10-CM | POA: Insufficient documentation

## 2022-04-03 DIAGNOSIS — Z23 Encounter for immunization: Secondary | ICD-10-CM | POA: Insufficient documentation

## 2022-04-03 DIAGNOSIS — S61012A Laceration without foreign body of left thumb without damage to nail, initial encounter: Secondary | ICD-10-CM | POA: Insufficient documentation

## 2022-04-03 MED ORDER — LIDOCAINE HCL (PF) 1 % IJ SOLN
5.0000 mL | Freq: Once | INTRAMUSCULAR | Status: AC
  Administered 2022-04-03: 5 mL
  Filled 2022-04-03: qty 5

## 2022-04-03 MED ORDER — TETANUS-DIPHTH-ACELL PERTUSSIS 5-2.5-18.5 LF-MCG/0.5 IM SUSY
0.5000 mL | PREFILLED_SYRINGE | Freq: Once | INTRAMUSCULAR | Status: AC
  Administered 2022-04-03: 0.5 mL via INTRAMUSCULAR
  Filled 2022-04-03: qty 0.5

## 2022-04-03 MED ORDER — CEPHALEXIN 500 MG PO CAPS: 500.0000 mg | ORAL_CAPSULE | Freq: Three times a day (TID) | ORAL | 0 refills | Status: AC

## 2022-04-03 MED ORDER — BACITRACIN-NEOMYCIN-POLYMYXIN 400-5-5000 EX OINT
TOPICAL_OINTMENT | Freq: Once | CUTANEOUS | Status: AC
  Filled 2022-04-03: qty 1

## 2022-04-03 NOTE — Discharge Instructions (Signed)
You were seen today for a laceration of your left thumb.  Your x-ray did not show any underlying fracture or foreign body.  You received a tetanus injection.  I have repaired this laceration with 4 sutures.  We have placed you in a splint for protection.  Please wear this until you are evaluated by orthopedics.  Avoid submersing your left hand in water until the sutures are removed.  I put you on antibiotics 3 times daily for the next 7 days to prevent infection. ?

## 2022-04-03 NOTE — ED Provider Notes (Signed)
? ?[facility] ?Provider Note ? ? ? Event Date/Time  ? First MD Initiated Contact with Patient 04/03/22 0715   ?  (approximate) ? ? ?History  ? ?Laceration ? ? ?HPI ? ?James Christensen is a 26 y.o. male presents to the ER today with complaint of a laceration to his left thumb.  This occurred at some point last night while he was out drinking.  He is unsure exactly how it happened or what time it occurred.  He was unable to control the bleeding so he presented to the ER.  He has no idea when his last tetanus shot was. ? ?  ? ? ?Physical Exam  ? ?Triage Vital Signs: ?ED Triage Vitals  ?Enc Vitals Group  ?   BP 04/03/22 0718 128/78  ?   Pulse Rate 04/03/22 0718 78  ?   Resp 04/03/22 0718 18  ?   Temp 04/03/22 0718 98 ?F (36.7 ?C)  ?   Temp Source 04/03/22 0718 Oral  ?   SpO2 04/03/22 0718 100 %  ?   Weight 04/03/22 0633 135 lb (61.2 kg)  ?   Height 04/03/22 0633 5\' 5"  (1.651 m)  ?   Head Circumference --   ?   Peak Flow --   ?   Pain Score 04/03/22 0633 0  ?   Pain Loc --   ?   Pain Edu? --   ?   Excl. in GC? --   ? ? ?Most recent vital signs: ?Vitals:  ? 04/03/22 0718  ?BP: 128/78  ?Pulse: 78  ?Resp: 18  ?Temp: 98 ?F (36.7 ?C)  ?SpO2: 100%  ? ? ? ?General: Awake, no distress.  ?CV:  RRR, no murmur.  Radial pulse 2+ on the left.  Cap refill <3 seconds on left thumb. ?Resp:  CTA bilaterally. ?MSK:  Slightly decreased active flexion of the left thumb.  Normal passive flexion of the left thumb.  Normal extension of the left thumb.  Normal resistance to plantarflexion of the left thumb. ?Skin:  1.5 cm jagged laceration noted over the dorsal aspect of the distal left thumb, just adjacent to the nailbed. Bone visualizedNo nail trauma noted. ? ? ?ED Results / Procedures / Treatments  ? ? ?RADIOLOGY ? ?Imaging Orders    ?     DG Finger Thumb Left    ?IMPRESSION: Soft tissue injury without fracture or opaque foreign body.  ? ?PROCEDURES: ? ?Critical Care performed: No ? ?Marland Kitchen.Laceration Repair ? ?Date/Time:  04/03/2022 8:17 AM ?Performed by: Lorre Munroe, NP ?Authorized by: Lorre Munroe, NP  ? ?Consent:  ?  Consent obtained:  Verbal ?  Consent given by:  Patient and spouse ?  Risks, benefits, and alternatives were discussed: yes   ?  Risks discussed:  Infection, pain, poor wound healing and poor cosmetic result ?Universal protocol:  ?  Imaging studies available: yes   ?  Site/side marked: yes   ?  Immediately prior to procedure, a time out was called: yes   ?  Patient identity confirmed:  Verbally with patient and arm band ?Anesthesia:  ?  Anesthesia method:  Local infiltration ?  Local anesthetic:  Lidocaine 1% w/o epi ?Laceration details:  ?  Location:  Finger ?  Finger location:  L thumb ?  Length (cm):  1.5 ?  Depth (mm):  4 ?Pre-procedure details:  ?  Preparation:  Patient was prepped and draped in usual sterile fashion and imaging obtained to  evaluate for foreign bodies ?Exploration:  ?  Limited defect created (wound extended): no   ?  Hemostasis achieved with:  Direct pressure ?  Imaging obtained: x-ray   ?  Imaging outcome: foreign body not noted   ?  Wound exploration: wound explored through full range of motion and entire depth of wound visualized   ?  Contaminated: no   ?Treatment:  ?  Area cleansed with:  Povidone-iodine and saline ?  Amount of cleaning:  Extensive ?  Irrigation solution:  Sterile saline ?  Irrigation method:  Syringe ?  Visualized foreign bodies/material removed: no   ?  Debridement:  None ?  Undermining:  None ?  Scar revision: no   ?Skin repair:  ?  Repair method:  Sutures ?  Suture size:  4-0 ?  Suture material:  Nylon ?  Suture technique:  Simple interrupted ?  Number of sutures:  4 ?Approximation:  ?  Approximation:  Loose ?Repair type:  ?  Repair type:  Intermediate ?Post-procedure details:  ?  Dressing:  Antibiotic ointment and splint for protection ?  Procedure completion:  Tolerated well, no immediate complications ? ? ?MEDICATIONS ORDERED IN ED: ?Medications   ?neomycin-bacitracin-polymyxin (NEOSPORIN) ointment packet (has no administration in time range)  ?lidocaine (PF) (XYLOCAINE) 1 % injection 5 mL (5 mLs Infiltration Given 04/03/22 0756)  ?Tdap (BOOSTRIX) injection 0.5 mL (0.5 mLs Intramuscular Given 04/03/22 0755)  ? ? ? ?IMPRESSION / MDM / ASSESSMENT AND PLAN / ED COURSE  ?I reviewed the triage vital signs and the nursing notes. ? ?Laceration of Left Thumb ? ?Differential diagnosis includes, but is not limited to, laceration of left thumb without tendon involvement, laceration of left thumb with tendon involvement ? ?X-ray left thumb shows not acute fracture or foreign body per my read, confirmed by radiology ?Tdap given ?Laceration repaired- see procedure note ?Splint applied for protection ?RX for Keflex 500 mg TID x 7 days ?Will have him follow up with orthopedics for further evaluation given depth of wound and bone visualization ? ?  ? ? ?FINAL CLINICAL IMPRESSION(S) / ED DIAGNOSES  ? ?Final diagnoses:  ?Laceration of left thumb without foreign body without damage to nail, initial encounter  ? ? ? ?Rx / DC Orders  ? ?ED Discharge Orders   ? ?      Ordered  ?  cephALEXin (KEFLEX) 500 MG capsule  3 times daily       ? 04/03/22 [id number]  ? ?  ?  ? ?  ? ? ? ?Note:  This document was prepared using Dragon voice recognition software and may include unintentional dictation errors. ? ?  ?Jearld Fenton, NP ?04/03/22 [id number] ? ?  ?Blake Divine, MD ?04/03/22 [phone] ? ?

## 2022-04-03 NOTE — ED Notes (Signed)
See triage note  presents with laceration to left thumb   unsure of time or what caused the cut   laceration noted to mid thumb  good ROM ?

## 2022-04-03 NOTE — ED Triage Notes (Addendum)
Pt with bleeding controlled laceration to left thumb. Pt states he was at a party consuming etoh last night when occurred, but he does not know what he lacerated thumb on or exact time. Pt with laceration noted to mid thumb over joint, cms intact.  ?

## 2023-05-03 ENCOUNTER — Ambulatory Visit: Payer: Medicaid Other | Admitting: Family Medicine

## 2023-05-03 ENCOUNTER — Encounter: Payer: Self-pay | Admitting: Family Medicine

## 2023-05-03 DIAGNOSIS — A539 Syphilis, unspecified: Secondary | ICD-10-CM

## 2023-05-03 DIAGNOSIS — Z113 Encounter for screening for infections with a predominantly sexual mode of transmission: Secondary | ICD-10-CM

## 2023-05-03 LAB — HM HIV SCREENING LAB: HM HIV Screening: NEGATIVE

## 2023-05-03 MED ORDER — PENICILLIN G BENZATHINE 1200000 UNIT/2ML IM SUSY
2.4000 10*6.[IU] | PREFILLED_SYRINGE | Freq: Once | INTRAMUSCULAR | Status: AC
  Administered 2023-05-03: 2.4 10*6.[IU] via INTRAMUSCULAR

## 2023-05-03 NOTE — Progress Notes (Signed)
Patient here for STD testing. He states he has a "sensitive spot" he wants to have checked.Burt Knack, RN

## 2023-05-03 NOTE — Progress Notes (Signed)
Bicillin given, per provider orders. Patient counseled no sex for 3 weeks and to wait for results. Patient counseled to call [facility] if he does not hear from [place] within 3 weeks. Patient walked in clinic for observation for 15+ minutes and states he feels fine. Burt Knack, RN

## 2023-05-03 NOTE — Progress Notes (Signed)
[facility] Department STI clinic/screening visit  Subjective:  James Christensen is a 27 y.o. male being seen today for an STI screening visit. The patient reports they do have symptoms.    Patient has the following medical conditions:  There are no problems to display for this patient.    Chief Complaint  Patient presents with   SEXUALLY TRANSMITTED DISEASE    HPI  Patient reports to clinic with a "bump" that has been there since last Wednesday- not painful, not itchy.   Last HIV test per patient/review of record was  Lab Results  Component Value Date   HMHIVSCREEN Negative - Validated 03/31/2020   No results found for: "HIV"  Does the patient or their partner desires a pregnancy in the next year? No  Screening for MPX risk: Does the patient have an unexplained rash? No Is the patient MSM? No Does the patient endorse multiple sex partners or anonymous sex partners? Yes Did the patient have close or sexual contact with a person diagnosed with MPX? No Has the patient traveled outside the [place] where MPX is endemic? No Is there a high clinical suspicion for MPX-- evidenced by one of the following No  -Unlikely to be chickenpox  -Lymphadenopathy  -Rash that present in same phase of evolution on any given body part   See flowsheet for further details and programmatic requirements.   Immunization History  Administered Date(s) Administered   Tdap 04/03/2022     The following portions of the patient's history were reviewed and updated as appropriate: allergies, current medications, past medical history, past social history, past surgical history and problem list.  Objective:  There were no vitals filed for this visit.  Physical Exam Constitutional:      Appearance: Normal appearance.  HENT:     Head: Normocephalic and atraumatic.     Comments: No nits or hair loss    Mouth/Throat:     Mouth: Mucous membranes are moist. No oral lesions.     Pharynx:  Oropharynx is clear. No oropharyngeal exudate or posterior oropharyngeal erythema.  Eyes:     General:        Right eye: No discharge.        Left eye: No discharge.     Conjunctiva/sclera:     Right eye: Right conjunctiva is not injected. No exudate.    Left eye: Left conjunctiva is not injected. No exudate. Pulmonary:     Effort: Pulmonary effort is normal.  Abdominal:     General: Abdomen is flat.     Palpations: Abdomen is soft. There is no hepatomegaly or mass.     Tenderness: There is no abdominal tenderness. There is no rebound.     Hernia: There is no hernia in the left inguinal area or right inguinal area.  Genitourinary:    Pubic Area: No rash or pubic lice (no nits).      Penis: Lesions present. No tenderness, discharge or swelling.      Testes: Normal.     Epididymis:     Right: Normal. No mass or tenderness.     Left: Normal. No mass or tenderness.     Rectum: Normal. No tenderness (no lesions or discharge).       Comments: Penile Discharge Amount: none Color:  none   Lesion on right shaft of penis- flat, erythematous- painless Lymphadenopathy:     Head:     Right side of head: No preauricular or posterior auricular adenopathy.  Left side of head: No preauricular or posterior auricular adenopathy.     Cervical: No cervical adenopathy.     Upper Body:     Right upper body: No supraclavicular, axillary or epitrochlear adenopathy.     Left upper body: No supraclavicular, axillary or epitrochlear adenopathy.     Lower Body: No right inguinal adenopathy. No left inguinal adenopathy.  Skin:    General: Skin is warm and dry.     Findings: No lesion or rash.  Neurological:     Mental Status: He is alert and oriented to person, place, and time.       Assessment and Plan:  James Christensen is a 27 y.o. male presenting to the [facility] for STI screening  1. Screening for venereal disease  - HIV [place] [facility] - Syphilis Serology, [place]  [facility] - Chlamydia/GC NAA, Confirmation - Gonococcus culture  2. 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 Patient meets criteria for HepB screening? No. Ordered? not applicable Patient meets criteria for HepC screening? No. Ordered? not applicable Recommended condom use with all sex Discussed importance of condom use for STI prevent  Treat positive test results per standing order. Discussed time line for State Lab results and that patient will be called with positive results and encouraged patient to call if he had not heard in 2 weeks Recommended repeat testing in 3 months with positive results. Recommended returning for continued or worsening symptoms.   Return if symptoms worsen or fail to improve, for STI screening.  No future appointments. Total time spent 20 minutes Lenice Llamas, [place]

## 2023-05-08 LAB — CHLAMYDIA/GC NAA, CONFIRMATION
Chlamydia trachomatis, NAA: NEGATIVE
Neisseria gonorrhoeae, NAA: NEGATIVE

## 2023-05-08 LAB — GONOCOCCUS CULTURE

## 2023-06-10 NOTE — Addendum Note (Signed)
Addended by: Heywood Bene on: 06/10/2023 03:20 PM   Modules accepted: Orders

## 2023-09-04 IMAGING — DX DG FINGER THUMB 2+V*L*
3 series · 3 of 3 positions shown · non-contrast
Comparison: 07/15/2020

CLINICAL DATA: Thumb laceration

EXAM:
LEFT THUMB 2+V

[finger ap]
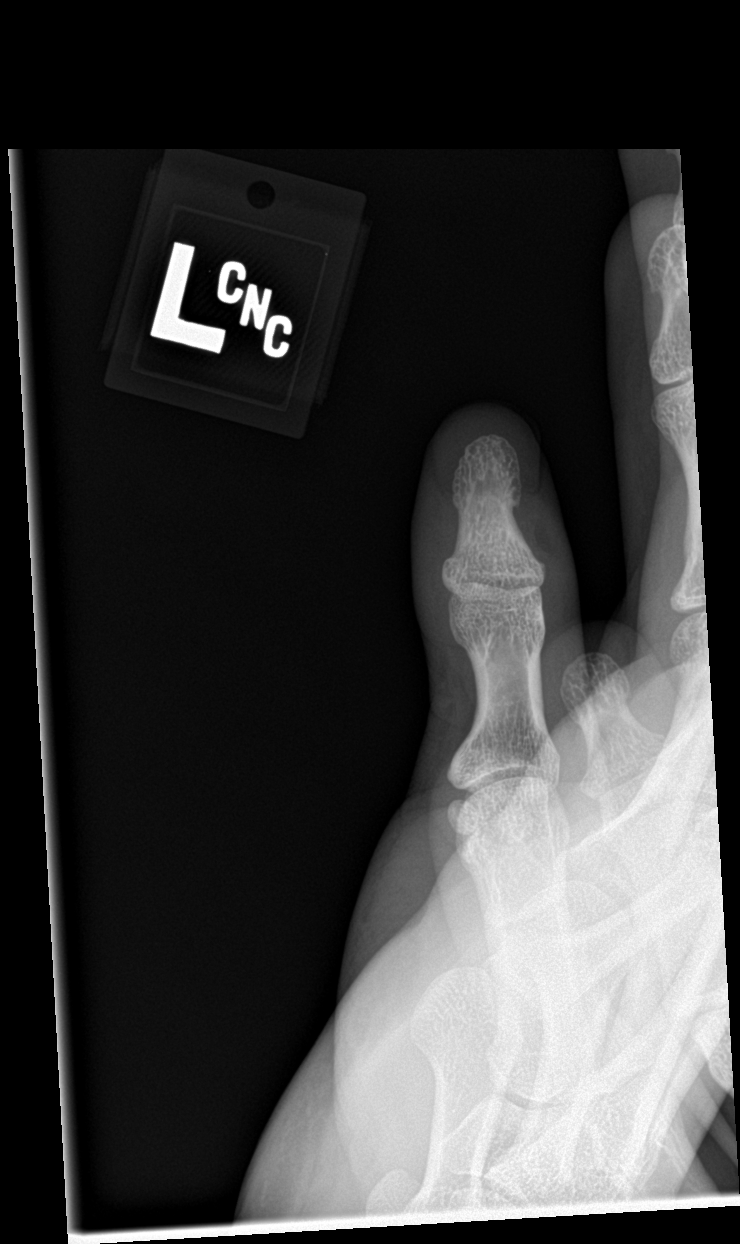

[finger obl]
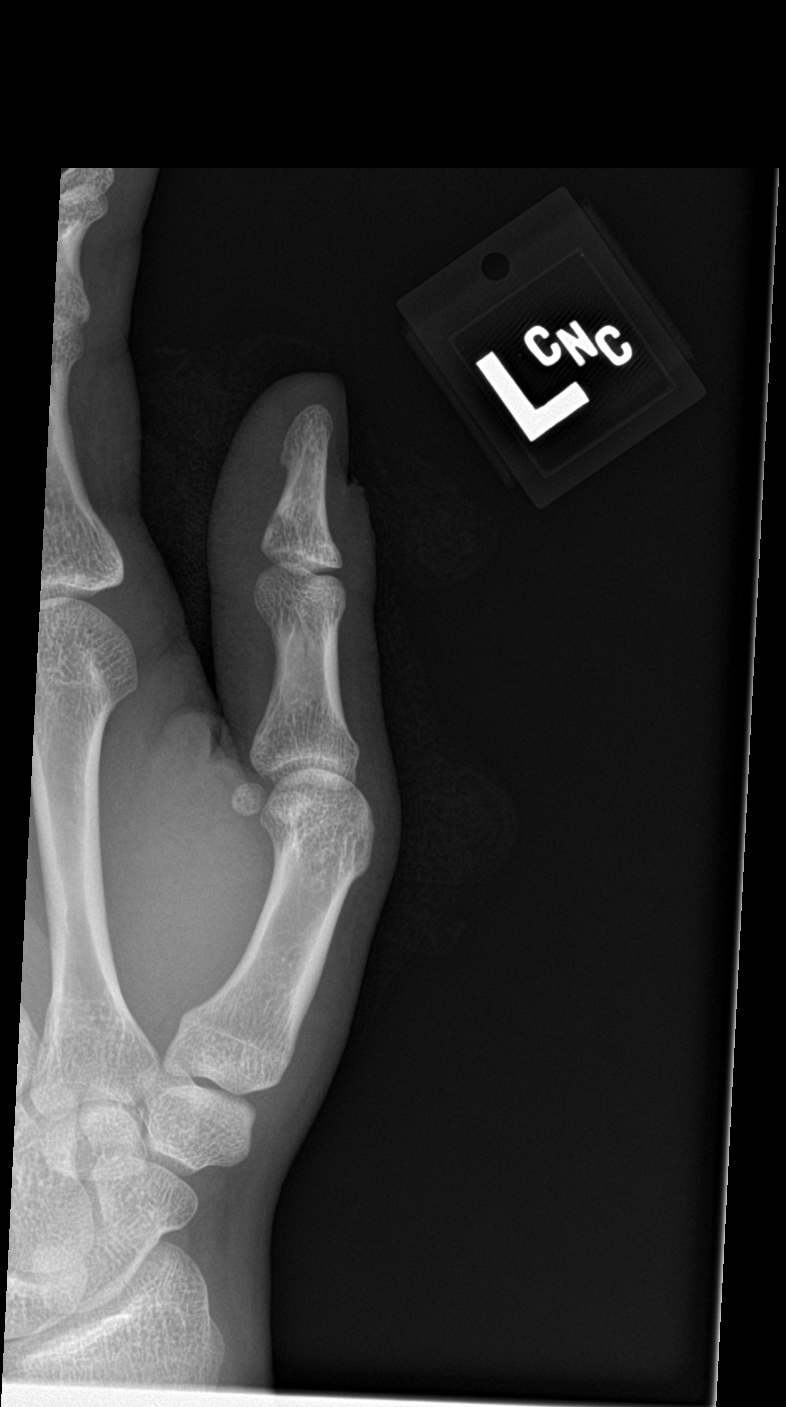

[finger lat]
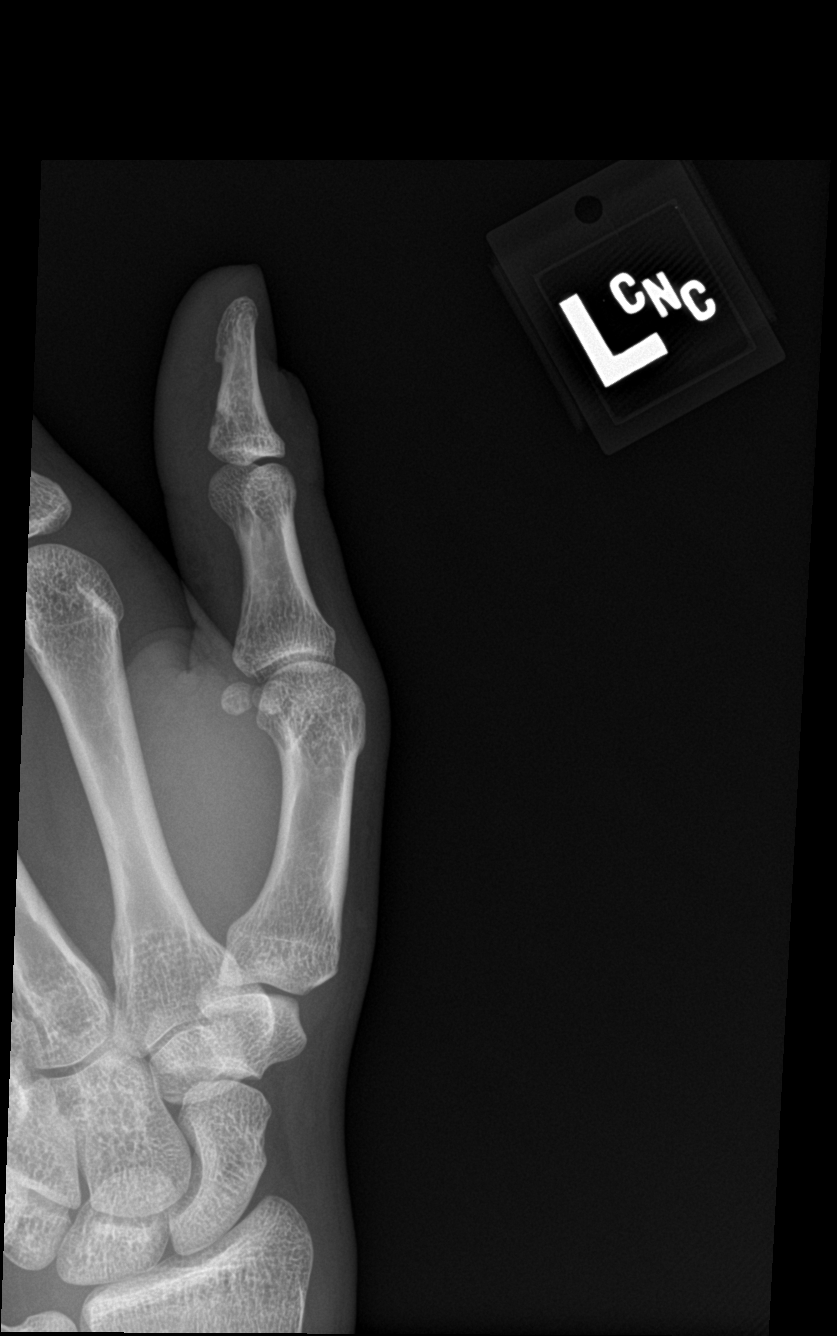

[3 of 3 positions shown; findings below may reference images not displayed]

FINDINGS: Soft tissue gas with irregularity near the base of the nail bed. No
acute fracture or opaque foreign body.
IMPRESSION: Soft tissue injury without fracture or opaque foreign body.

## 2024-06-06 ENCOUNTER — Telehealth: Payer: Self-pay | Admitting: Family Medicine

## 2024-06-08 ENCOUNTER — Encounter: Payer: Self-pay | Admitting: Nurse Practitioner

## 2024-06-08 ENCOUNTER — Ambulatory Visit: Admitting: Nurse Practitioner

## 2024-06-08 DIAGNOSIS — Z113 Encounter for screening for infections with a predominantly sexual mode of transmission: Secondary | ICD-10-CM | POA: Diagnosis not present

## 2024-06-08 DIAGNOSIS — B009 Herpesviral infection, unspecified: Secondary | ICD-10-CM | POA: Insufficient documentation

## 2024-06-08 LAB — HM HIV SCREENING LAB: HM HIV Screening: NEGATIVE

## 2024-06-08 LAB — HM HEPATITIS C SCREENING LAB: HM Hepatitis Screen: NEGATIVE

## 2024-06-08 MED ORDER — VALACYCLOVIR HCL 1 G PO TABS: 1000.0000 mg | ORAL_TABLET | Freq: Every day | ORAL | 3 refills | Status: AC

## 2024-06-08 NOTE — Progress Notes (Signed)
 [facility] Department STI clinic [address], [address] [place] [address] Main phone: [phone]  STI screening visit  Subjective:  James Christensen is a 28 y.o. male being seen today for an STI screening visit. The patient reports they do have symptoms.    Patient has the following medical conditions:  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   Chief Complaint  Patient presents with   SEXUALLY TRANSMITTED DISEASE    HPI Patient reports a bump and a cut on his penis that appeared on Monday. They were painful but are not any more. He has 1 male partner that he does not use condoms with.  See flowsheet for further details and programmatic requirements  Hyperlink available at the top of the signed note in blue.  Flow sheet content below:  Pregnancy Intention Screening Does the patient want to become pregnant in the next year?: No Does the patient's partner want to become pregnant in the next year?: No Would the patient like to discuss contraceptive options today?: No Reason For STD Screen STD Screening: Has symptoms Have you ever had an STD?: Yes History of Antibiotic use in the past 2 weeks?: No STD Symptoms Genital Itching: Yes Genital ulcer / lesion: Yes Risk Factors for Hep B Household, sexual, or needle sharing contact of a person infected with Hep B: No Sexual contact with a person who uses drugs not as prescribed?: No Currently or Ever used drugs not as prescribed: No HIV Positive: No PRep Patient: No Men who have sex with men: No Have Hepatitis C: No History of Incarceration: Yes History of Homeslessness?: No Anal sex following anal drug use?: No Risk Factors for Hep C Currently using drugs not as prescribed: No Sexual partner(s) currently using drugs as not prescribed: No History of drug use: No HIV Positive: No People with a history of incarceration: Yes People born between the years of  44 and 45: No Hepatitis Counseling Hep B Counseling: Counseled patient about increased risk of Hep B and recommendation for testing, Patient accepts testing for Hep B today Hep C Counseling: Counseled patient about increased risk of Hep C and recommendation for testing, Patient accepts testing for Hep C today Abuse History Has patient ever been abused physically?: No Has patient ever been abused sexually?: No Does patient feel they have a problem with Anxiety?: No Does patient feel they have a problem with Depression?: No Referral to Behavioral Health: No Counseling Patient counseled to use condoms with all sex: Condoms declined RTC in 2-3 weeks for test results: Yes Clinic will call if test results abnormal before test result appt.: Yes Immunizations: Referred Test results given to patient Patient counseled to use condoms with all sex: Condoms declined  Screening for MPX risk: Does the patient have an unexplained rash? No Is the patient MSM? No Does the patient endorse multiple sex partners or anonymous sex partners? No Did the patient have close or sexual contact with a person diagnosed with MPX? No Has the patient traveled outside the [place]  where MPX is endemic? No Is there a high clinical suspicion for MPX-- evidenced by one of the following No  -Unlikely to be chickenpox  -Lymphadenopathy  -Rash that present in same phase of evolution on any given body part  STI screening history: Last HIV test per patient/review of record was  Lab Results  Component Value Date   HMHIVSCREEN Negative - Validated 05/03/2023   No results found  for: HIV  Last HEPC test per patient/review of record was  Lab Results  Component Value Date   HMHEPCSCREEN Negative-Validated 03/31/2020   No components found for: HEPC   Last HEPB test per patient/review of record was No components found for: HMHEPBSCREEN   Fertility: Does the patient or their partner desires a pregnancy in the next  year? No  Immunization History  Administered Date(s) Administered   Tdap 04/03/2022    The following portions of the patient's history were reviewed and updated as appropriate: allergies, current medications, past medical history, past social history, past surgical history and problem list.  Objective:  There were no vitals filed for this visit.  Physical Exam Exam conducted with a chaperone present James Christensen, Charity fundraiser).  Constitutional:      Appearance: Normal appearance.  HENT:     Head: Normocephalic and atraumatic.     Comments: No nits or hair loss    Mouth/Throat:     Mouth: Mucous membranes are moist. No oral lesions.     Pharynx: Oropharynx is clear. No oropharyngeal exudate or posterior oropharyngeal erythema.  Eyes:     General:        Right eye: No discharge.        Left eye: No discharge.     Conjunctiva/sclera:     Right eye: Right conjunctiva is not injected. No exudate.    Left eye: Left conjunctiva is not injected. No exudate. Pulmonary:     Effort: Pulmonary effort is normal.  Abdominal:     General: Abdomen is flat.     Palpations: Abdomen is soft. There is no hepatomegaly or mass.     Tenderness: There is no abdominal tenderness. There is no rebound.     Hernia: There is no hernia in the left inguinal area or right inguinal area.  Genitourinary:    Pubic Area: No rash or pubic lice (no nits).      Penis: Lesions (1 abrasion in fold between head of penis and shaft on the left. Another small circular lesion on right shaft.) present. No tenderness, discharge or swelling.      Testes: Normal.     Epididymis:     Right: Normal. No mass or tenderness.     Left: Normal. No mass or tenderness.    Lymphadenopathy:     Head:     Right side of head: No preauricular or posterior auricular adenopathy.     Left side of head: No preauricular or posterior auricular adenopathy.     Cervical: No cervical adenopathy.     Upper Body:     Right upper body: No supraclavicular,  axillary or epitrochlear adenopathy.     Left upper body: No supraclavicular, axillary or epitrochlear adenopathy.     Lower Body: No right inguinal adenopathy. No left inguinal adenopathy.  Skin:    General: Skin is warm and dry.     Findings: No lesion or rash.  Neurological:     Mental Status: He is alert and oriented to person, place, and time.  Psychiatric:        Mood and Affect: Mood normal.        Behavior: Behavior normal.      Assessment and Plan:  Ramirez GABRIELE LOVELAND is a 28 y.o. male presenting to the [facility] Department for STI screening  1. Screening for venereal disease (Primary)  - Syphilis Serology, [place] [facility] - HIV [place] [facility] - Chlamydia/GC NAA, Confirmation - HCV [place]  [facility] - HBV Antigen/Antibody State Lab - Virology, [place] [facility] - HSV  2. Herpes Pt denies prodromal symptoms States that the lesions were painful at the beginning of the week. Now they are painless Unsure if swab is adequately able to collect a viral load in this state of the lesion healing. Lesions appear to be HSV, but if RPR is reactive may change this dx to syphilis Discussed HSV dx w pt including symptoms, course of dz, stigma, management, prevalence. Advised no sexual activity during outbreak to minimize transmission Rec taking antivirals to limit outbreak.  - valACYclovir (VALTREX) 1000 MG tablet; Take 1 tablet (1,000 mg total) by mouth daily for 5 days.  Dispense: 15 tablet; Refill: 3    Patient does have STI symptoms Patient accepted the following screenings: urine CT/GC, HIV, RPR, Hep B, and Hep C Patient meets criteria for HepB screening? Yes. Ordered? yes Patient meets criteria for HepC screening? Yes. Ordered? yes Recommended condom use with all sex Discussed importance of condom use for STI prevention  Treat positive test results per standing order. Discussed time line for State Lab results and that patient will be called with positive results and  encouraged patient to call if he had not heard in 2 weeks Recommended repeat testing in 3 months with positive results. Recommended returning for continued or worsening symptoms.   Return in about 3 months (around 09/08/2024), or if symptoms worsen or fail to improve, for routine screening.  No future appointments.  Mardel Grudzien K Aundrey Elahi, NP

## 2024-06-12 ENCOUNTER — Ambulatory Visit

## 2024-06-12 LAB — CHLAMYDIA/GC NAA, CONFIRMATION
Chlamydia trachomatis, NAA: NEGATIVE
Neisseria gonorrhoeae, NAA: NEGATIVE

## 2024-06-12 LAB — VIROLOGY, ~~LOC~~ LAB

## 2024-06-13 ENCOUNTER — Telehealth: Payer: Self-pay

## 2024-06-13 NOTE — Telephone Encounter (Signed)
 Call pt re 06/08/24 HSV specimen from penis. State lab indicated specimen not tested, container leaked or was broken in transit.

## 2024-06-13 NOTE — Telephone Encounter (Signed)
 Phone call to pt at [phone]. Pt answered and confirmed password.  Counseled pt re issue with 06/08/24 HSV specimen. Discussed options.  Pt stated he had already spoken with someone at [facility] and already has another appt with [facility] on 06/14/24.

## 2024-06-14 ENCOUNTER — Ambulatory Visit: Admitting: Family Medicine

## 2024-06-14 DIAGNOSIS — N489 Disorder of penis, unspecified: Secondary | ICD-10-CM

## 2024-06-14 LAB — HBV ANTIGEN/ANTIBODY STATE LAB
Hep B Core Total Ab: NONREACTIVE
Hep B S Ab: NONREACTIVE
Hepatitis B Surface Antigen: NONREACTIVE

## 2024-06-14 NOTE — Progress Notes (Signed)
 [facility] Department STI clinic [address] [place] [address] Main phone: [phone]  STI screening visit  Subjective:  James Christensen is a 28 y.o. male being seen today for an STI screening visit. The patient reports they do have symptoms.    Patient has the following medical conditions:  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   Chief Complaint  Patient presents with   SEXUALLY TRANSMITTED DISEASE   HPI Patient is here for re-swabbing of a penile lesion. He presented on 7/18 and received full STI screening, including HSV swabbing. His HSV swab leaked in transport and was unable to be tested. He reports he still has two penile lesions and would like the area re-swabbed.  Discussed limited utility of swabbing healing lesions without any discharge or fluid; he elects to proceed with swabbing.   See flowsheet for further details and programmatic requirements  Hyperlink available at the top of the signed note in blue.  Flow sheet content below:  Pregnancy Intention Screening Does the patient want to become pregnant in the next year?: No Does the patient's partner want to become pregnant in the next year?: No Would the patient like to discuss contraceptive options today?: N/A Reason For STD Screen STD Screening: Has symptoms Have you ever had an STD?: Yes History of Antibiotic use in the past 2 weeks?: No STD Symptoms Genital Itching: No Lower abdominal pain: No Discharge: No Dysuria: No Genital ulcer / lesion: Yes Rash: No Vaginal irritation: No Oral / Other skin ulcer: No Pain with sex: No Sore Throat: No Visual Changes: No Vaginal Bleeding: No Risk Factors for Hep B Household, sexual, or needle sharing contact of a person infected with Hep B: No Sexual contact with a person who uses drugs not as prescribed?: No Currently or Ever used drugs not as prescribed: No HIV Positive: No PRep  Patient: No Men who have sex with men: No Have Hepatitis C: No History of Incarceration: Yes History of Homeslessness?: No Anal sex following anal drug use?: No Risk Factors for Hep C Currently using drugs not as prescribed: No Sexual partner(s) currently using drugs as not prescribed: No History of drug use: No HIV Positive: No People with a history of incarceration: Yes People born between the years of 8 and 62: No Abuse History Has patient ever been abused physically?: No Has patient ever been abused sexually?: No Does patient feel they have a problem with Anxiety?: No Does patient feel they have a problem with Depression?: No Referral to Behavioral Health: No Counseling Patient counseled to use condoms with all sex: Condoms declined RTC in 2-3 weeks for test results: Yes Clinic will call if test results abnormal before test result appt.: Yes Test results given to patient Patient counseled to use condoms with all sex: Condoms declined  Screening for MPX risk: Does the patient have an unexplained rash? No Is the patient MSM? No Does the patient endorse multiple sex partners or anonymous sex partners? No Did the patient have close or sexual contact with a person diagnosed with MPX? No Has the patient traveled outside the [place]  where MPX is endemic? No Is there a high clinical suspicion for MPX-- evidenced by one of the following No  -Unlikely to be chickenpox  -Lymphadenopathy  -Rash that present in same phase of evolution on any given body part  STI screening history: Last HIV test per patient/review of record was  Lab Results  Component Value Date   HMHIVSCREEN Negative - Validated 05/03/2023   No results found for: HIV  Last HEPC test per patient/review of record was  Lab Results  Component Value Date   HMHEPCSCREEN Negative-Validated 03/31/2020   No components found for: HEPC   Last HEPB test per patient/review of record was No components found for:  HMHEPBSCREEN   Fertility: Does the patient or their partner desires a pregnancy in the next year? No  Immunization History  Administered Date(s) Administered   Tdap 04/03/2022    The following portions of the patient's history were reviewed and updated as appropriate: allergies, current medications, past medical history, past social history, past surgical history and problem list.  Objective:  There were no vitals filed for this visit.  Physical Exam Exam conducted with a chaperone present (Patient declines chaperone).  Constitutional:      Appearance: Normal appearance.  HENT:     Head: Normocephalic and atraumatic.     Comments: No nits or hair loss    Mouth/Throat:     Mouth: Mucous membranes are moist. No oral lesions.     Pharynx: Oropharynx is clear.  Eyes:     Conjunctiva/sclera:     Right eye: Right conjunctiva is not injected. No exudate.    Left eye: Left conjunctiva is not injected. No exudate. Pulmonary:     Effort: Pulmonary effort is normal.  Abdominal:     Palpations: There is no hepatomegaly.  Genitourinary:    Pubic Area: No rash or pubic lice (no nits).      Penis: Lesions (two erythmatous papular flat lesions at penile corona, 2 o'clock and 7 o'clock) present. No tenderness, discharge or swelling.      Tanner stage (genital): 5.     Rectum: Tenderness: no lesions or discharge.  Skin:    General: Skin is warm and dry.     Findings: No lesion or rash.  Neurological:     General: No focal deficit present.     Mental Status: He is alert and oriented to person, place, and time.  Psychiatric:        Mood and Affect: Mood normal.        Behavior: Behavior normal.    Assessment and Plan:  James Christensen is a 28 y.o. male presenting to the [facility] Department for STI screening  Penile lesion -     Virology, [place] [facility]  Patient does have STI symptoms Patient accepted the following screenings: virology  Recommended condom use with  all sex Discussed importance of condom use for STI prevention  Treat positive test results per standing order. Discussed time line for State Lab results and that patient will be called with positive results and encouraged patient to call if he had not heard in 2 weeks Recommended repeat testing in 3 months with positive results. Recommended returning for continued or worsening symptoms.   No follow-ups on file.  No future appointments.  Betsey CHRISTELLA Helling, MD

## 2024-06-15 LAB — VIROLOGY, ~~LOC~~ LAB
HSV 1 DNA: NEGATIVE
HSV 2 , PCR: NEGATIVE
Varicella-Zoster, PCR: NEGATIVE

## 2024-06-18 ENCOUNTER — Ambulatory Visit: Payer: Self-pay | Admitting: Family Medicine

## 2024-06-18 ENCOUNTER — Encounter: Payer: Self-pay | Admitting: Nurse Practitioner

## 2024-06-18 NOTE — Progress Notes (Signed)
 Negative herpes swab.   Dorothyann Helling, MD 06/18/24  4:34 PM

## 2024-08-01 ENCOUNTER — Other Ambulatory Visit: Payer: Self-pay

## 2024-08-01 ENCOUNTER — Emergency Department
Admission: EM | Admit: 2024-08-01 | Discharge: 2024-08-01 | Disposition: A | Discharge status: Home / Self Care | Attending: Emergency Medicine | Admitting: Emergency Medicine

## 2024-08-01 DIAGNOSIS — J029 Acute pharyngitis, unspecified: Secondary | ICD-10-CM | POA: Insufficient documentation

## 2024-08-01 DIAGNOSIS — R509 Fever, unspecified: Secondary | ICD-10-CM | POA: Diagnosis not present

## 2024-08-01 LAB — MONONUCLEOSIS SCREEN: Mono Screen: NEGATIVE

## 2024-08-01 LAB — RESP PANEL BY RT-PCR (RSV, FLU A&B, COVID)  RVPGX2
Influenza A by PCR: NEGATIVE
Influenza B by PCR: NEGATIVE
Resp Syncytial Virus by PCR: NEGATIVE
SARS Coronavirus 2 by RT PCR: NEGATIVE

## 2024-08-01 LAB — GROUP A STREP BY PCR: Group A Strep by PCR: NOT DETECTED

## 2024-08-01 MED ORDER — SODIUM CHLORIDE 0.9 % IV BOLUS
500.0000 mL | Freq: Once | INTRAVENOUS | Status: AC
  Administered 2024-08-01: 500 mL via INTRAVENOUS

## 2024-08-01 MED ORDER — ACETAMINOPHEN 500 MG PO TABS
ORAL_TABLET | ORAL | Status: AC
  Filled 2024-08-01: qty 2

## 2024-08-01 MED ORDER — ACETAMINOPHEN 500 MG PO TABS
1000.0000 mg | ORAL_TABLET | Freq: Once | ORAL | Status: AC
  Administered 2024-08-01: 1000 mg via ORAL

## 2024-08-01 MED ORDER — KETOROLAC TROMETHAMINE 15 MG/ML IJ SOLN
15.0000 mg | Freq: Once | INTRAMUSCULAR | Status: AC
  Administered 2024-08-01: 15 mg via INTRAVENOUS
  Filled 2024-08-01: qty 1

## 2024-08-01 NOTE — ED Provider Notes (Signed)
 [facility] Provider Note    Event Date/Time   First MD Initiated Contact with Patient 08/01/24 1543     (approximate)   History   Sore Throat   HPI  James Christensen is a 28 y.o. male who presents for evaluation of sore throat for the past 2 days.  Patient reports that he has pain with swallowing but no difficulty swallowing.  No known sick contacts.  No voice change.  No drooling.  No neck pain or stiffness.  He has not noticed any rash.  No cough or shortness of breath.  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          Physical Exam   Triage Vital Signs: ED Triage Vitals  Encounter Vitals Group     BP 08/01/24 1418 116/63     Girls Systolic BP Percentile --      Girls Diastolic BP Percentile --      Boys Systolic BP Percentile --      Boys Diastolic BP Percentile --      Pulse Rate 08/01/24 1418 80     Resp 08/01/24 1417 16     Temp 08/01/24 1417 (!) 103.2 F (39.6 C)     Temp Source 08/01/24 1417 Oral     SpO2 08/01/24 1418 98 %     Weight 08/01/24 1419 134 lb 14.7 oz (61.2 kg)     Height --      Head Circumference --      Peak Flow --      Pain Score 08/01/24 1418 10     Pain Loc --      Pain Education --      Exclude from Growth Chart --     Most recent vital signs: Vitals:   08/01/24 1418 08/01/24 1650  BP: 116/63 115/65  Pulse: 80 (!) 55  Resp: 16 16  Temp:  98.7 F (37.1 C)  SpO2: 98% 96%    Physical Exam Vitals and nursing note reviewed.  Constitutional:      General: Awake and alert. No acute distress.    Appearance: Normal appearance. The patient is normal weight.  HENT:     Head: Normocephalic and atraumatic.     Mouth: Mucous membranes are moist. Uvula midline.  Mild posterior oropharyngeal erythema.  No tonsillar exudate.  No soft palate fluctuance.  No trismus.  No voice change.  No sublingual swelling.  No tender cervical lymphadenopathy.  No nuchal rigidity Eyes:      General: PERRL. Normal EOMs        Right eye: No discharge.        Left eye: No discharge.     Conjunctiva/sclera: Conjunctivae normal.  Cardiovascular:     Rate and Rhythm: Normal rate and regular rhythm.     Pulses: Normal pulses.  Pulmonary:     Effort: Pulmonary effort is normal. No respiratory distress.     Breath sounds: Normal breath sounds.  Abdominal:     Abdomen is soft. There is no abdominal tenderness. No rebound or guarding. No distention. Musculoskeletal:        General: No swelling. Normal range of motion.     Cervical back: Normal range of motion and neck supple.  Skin:    General: Skin is warm and dry.     Capillary Refill: Capillary refill takes less than 2 seconds.     Findings: No rash.  Neurological:  Mental Status: The patient is awake and alert.      ED Results / Procedures / Treatments   Labs (all labs ordered are listed, but only abnormal results are displayed) Labs Reviewed  RESP PANEL BY RT-PCR (RSV, FLU A&B, COVID)  [id number]  GROUP A STREP BY PCR  MONONUCLEOSIS SCREEN     EKG     RADIOLOGY     PROCEDURES:  Critical Care performed:   Procedures   MEDICATIONS ORDERED IN ED: Medications  acetaminophen  (TYLENOL ) tablet 1,000 mg (1,000 mg Oral Given 08/01/24 1425)  ketorolac  (TORADOL ) 15 MG/ML injection 15 mg (15 mg Intravenous Given 08/01/24 1650)  sodium chloride  0.9 % bolus 500 mL (500 mLs Intravenous New Bag/Given 08/01/24 1649)     IMPRESSION / MDM / ASSESSMENT AND PLAN / ED COURSE  I reviewed the triage vital signs and the nursing notes.   Differential diagnosis includes, but is not limited to, viral pharyngitis, strep pharyngitis, COVID-19, other URI.  Patient is awake and alert, hemodynamically stable, though with a fever of 103.2.  He was given Tylenol  with resolution of his fever.  Patient is well-appearing on exam.  He has no tonsillar exudate.  His uvula is midline, no voice change, no drooling, no neck pain or  stiffness, not consistent with peritonsillar or retropharyngeal abscess.  He has full and normal range of motion of his neck, no headache, no rash, do not suspect meningitis at this time.  No altered mental status or encephalopathy.  Denies having spent any time outside or any known tick exposures to suggest tickborne illness.  Strep and COVID/flu/RSV swab obtained in triage is normal.  Patient was treated symptomatically with Tylenol , Toradol , and IV fluids with significant improvement of his symptoms.  He reports that he has never had mono before, therefore mono screen also obtained.  Mono was negative.  Upon reevaluation, patient reports that he feels significantly improved.  We discussed tricked return precautions and the importance of close outpatient follow-up.  We also discussed symptomatic management.  Patient understands and agrees with plan.  He was discharged in stable condition.   Patient's presentation is most consistent with acute complicated illness / injury requiring diagnostic workup.   FINAL CLINICAL IMPRESSION(S) / ED DIAGNOSES   Final diagnoses:  Pharyngitis, unspecified etiology     Rx / DC Orders   ED Discharge Orders     None        Note:  This document was prepared using Dragon voice recognition software and may include unintentional dictation errors.   Nelsie Domino E, PA-C 08/01/24 1755    Arlander Charleston, MD 08/01/24 TRENNA

## 2024-08-01 NOTE — ED Triage Notes (Signed)
 Sore throat since Monday.Pain with swallowing.  Voice clear and strong. Managing secretions.

## 2024-08-01 NOTE — Discharge Instructions (Addendum)
 Your COVID/flu/RSV and strep swabs were negative.  Please continue to take Tylenol /ibuprofen per package instructions to help with your symptoms as needed.  Please follow-up with your outpatient provider.  Please return for any new, worsening, or change in symptoms or other concerns.  It was a pleasure caring for you today.
